# Patient Record
Sex: Female | Born: 1960 | Race: White | Hispanic: No | Smoking: Never smoker
Health system: Southern US, Community
[De-identification: ages and names within clinical notes are randomized; demographics above are authoritative.]

## PROBLEM LIST (undated history)

## (undated) DIAGNOSIS — E785 Hyperlipidemia, unspecified: Secondary | ICD-10-CM

## (undated) DIAGNOSIS — E611 Iron deficiency: Secondary | ICD-10-CM

## (undated) DIAGNOSIS — F419 Anxiety disorder, unspecified: Secondary | ICD-10-CM

## (undated) DIAGNOSIS — L509 Urticaria, unspecified: Secondary | ICD-10-CM

## (undated) DIAGNOSIS — I10 Essential (primary) hypertension: Secondary | ICD-10-CM

## (undated) DIAGNOSIS — E119 Type 2 diabetes mellitus without complications: Secondary | ICD-10-CM

## (undated) HISTORY — DX: Iron deficiency: E61.1

## (undated) HISTORY — DX: Urticaria, unspecified: L50.9

## (undated) HISTORY — DX: Anxiety disorder, unspecified: F41.9

## (undated) HISTORY — PX: CERVICAL ABLATION: SHX5771

## (undated) HISTORY — PX: HERNIA REPAIR: SHX51

## (undated) HISTORY — PX: TUBAL LIGATION: SHX77

---

## 1997-12-23 ENCOUNTER — Ambulatory Visit (HOSPITAL_COMMUNITY): Admission: RE | Admit: 1997-12-23 | Discharge: 1997-12-23 | Payer: Self-pay | Admitting: Urology

## 1998-03-24 ENCOUNTER — Encounter: Payer: Self-pay | Admitting: Urology

## 1998-03-24 ENCOUNTER — Ambulatory Visit (HOSPITAL_COMMUNITY): Admission: RE | Admit: 1998-03-24 | Discharge: 1998-03-24 | Payer: Self-pay | Admitting: Urology

## 1998-08-27 ENCOUNTER — Ambulatory Visit (HOSPITAL_COMMUNITY): Admission: RE | Admit: 1998-08-27 | Discharge: 1998-08-27 | Payer: Self-pay | Admitting: Obstetrics and Gynecology

## 1999-01-14 ENCOUNTER — Other Ambulatory Visit: Admission: RE | Admit: 1999-01-14 | Discharge: 1999-01-14 | Payer: Self-pay | Admitting: Obstetrics and Gynecology

## 1999-09-12 ENCOUNTER — Encounter: Payer: Self-pay | Admitting: Obstetrics and Gynecology

## 1999-09-12 ENCOUNTER — Ambulatory Visit (HOSPITAL_COMMUNITY): Admission: RE | Admit: 1999-09-12 | Discharge: 1999-09-12 | Payer: Self-pay | Admitting: Obstetrics and Gynecology

## 1999-09-13 ENCOUNTER — Encounter: Payer: Self-pay | Admitting: Urology

## 1999-09-13 ENCOUNTER — Encounter: Admission: RE | Admit: 1999-09-13 | Discharge: 1999-09-13 | Payer: Self-pay | Admitting: Urology

## 2000-01-16 ENCOUNTER — Other Ambulatory Visit: Admission: RE | Admit: 2000-01-16 | Discharge: 2000-01-16 | Payer: Self-pay | Admitting: Obstetrics and Gynecology

## 2000-10-04 ENCOUNTER — Encounter: Payer: Self-pay | Admitting: Obstetrics and Gynecology

## 2000-10-04 ENCOUNTER — Ambulatory Visit (HOSPITAL_COMMUNITY): Admission: RE | Admit: 2000-10-04 | Discharge: 2000-10-04 | Payer: Self-pay | Admitting: Obstetrics and Gynecology

## 2001-03-14 ENCOUNTER — Other Ambulatory Visit: Admission: RE | Admit: 2001-03-14 | Discharge: 2001-03-14 | Payer: Self-pay | Admitting: Obstetrics and Gynecology

## 2001-03-28 ENCOUNTER — Encounter: Admission: RE | Admit: 2001-03-28 | Discharge: 2001-03-28 | Payer: Self-pay | Admitting: Urology

## 2001-03-28 ENCOUNTER — Encounter: Payer: Self-pay | Admitting: Urology

## 2001-04-04 ENCOUNTER — Encounter: Payer: Self-pay | Admitting: Urology

## 2001-04-04 ENCOUNTER — Ambulatory Visit (HOSPITAL_BASED_OUTPATIENT_CLINIC_OR_DEPARTMENT_OTHER): Admission: RE | Admit: 2001-04-04 | Discharge: 2001-04-04 | Payer: Self-pay | Admitting: Urology

## 2001-04-17 ENCOUNTER — Encounter: Admission: RE | Admit: 2001-04-17 | Discharge: 2001-04-17 | Payer: Self-pay | Admitting: Urology

## 2001-04-17 ENCOUNTER — Encounter: Payer: Self-pay | Admitting: Urology

## 2001-10-07 ENCOUNTER — Encounter: Payer: Self-pay | Admitting: Obstetrics and Gynecology

## 2001-10-07 ENCOUNTER — Ambulatory Visit (HOSPITAL_COMMUNITY): Admission: RE | Admit: 2001-10-07 | Discharge: 2001-10-07 | Payer: Self-pay | Admitting: Obstetrics and Gynecology

## 2001-10-16 ENCOUNTER — Encounter: Admission: RE | Admit: 2001-10-16 | Discharge: 2001-10-16 | Payer: Self-pay | Admitting: Urology

## 2001-10-16 ENCOUNTER — Encounter: Payer: Self-pay | Admitting: Urology

## 2002-02-13 ENCOUNTER — Ambulatory Visit (HOSPITAL_BASED_OUTPATIENT_CLINIC_OR_DEPARTMENT_OTHER): Admission: RE | Admit: 2002-02-13 | Discharge: 2002-02-13 | Payer: Self-pay | Admitting: Family Medicine

## 2002-03-14 ENCOUNTER — Other Ambulatory Visit: Admission: RE | Admit: 2002-03-14 | Discharge: 2002-03-14 | Payer: Self-pay | Admitting: Obstetrics and Gynecology

## 2002-09-18 ENCOUNTER — Ambulatory Visit (HOSPITAL_COMMUNITY): Admission: RE | Admit: 2002-09-18 | Discharge: 2002-09-18 | Payer: Self-pay | Admitting: Obstetrics and Gynecology

## 2002-09-18 ENCOUNTER — Encounter: Payer: Self-pay | Admitting: Obstetrics and Gynecology

## 2002-11-17 ENCOUNTER — Encounter: Payer: Self-pay | Admitting: Emergency Medicine

## 2002-11-17 ENCOUNTER — Emergency Department (HOSPITAL_COMMUNITY): Admission: EM | Admit: 2002-11-17 | Discharge: 2002-11-17 | Payer: Self-pay | Admitting: Emergency Medicine

## 2003-03-19 ENCOUNTER — Other Ambulatory Visit: Admission: RE | Admit: 2003-03-19 | Discharge: 2003-03-19 | Payer: Self-pay | Admitting: Obstetrics and Gynecology

## 2003-10-19 ENCOUNTER — Ambulatory Visit (HOSPITAL_COMMUNITY): Admission: RE | Admit: 2003-10-19 | Discharge: 2003-10-19 | Payer: Self-pay | Admitting: Obstetrics and Gynecology

## 2004-03-29 ENCOUNTER — Other Ambulatory Visit: Admission: RE | Admit: 2004-03-29 | Discharge: 2004-03-29 | Payer: Self-pay | Admitting: Obstetrics and Gynecology

## 2005-02-23 ENCOUNTER — Ambulatory Visit (HOSPITAL_COMMUNITY): Admission: RE | Admit: 2005-02-23 | Discharge: 2005-02-23 | Payer: Self-pay | Admitting: Obstetrics and Gynecology

## 2005-04-06 ENCOUNTER — Other Ambulatory Visit: Admission: RE | Admit: 2005-04-06 | Discharge: 2005-04-06 | Payer: Self-pay | Admitting: Obstetrics and Gynecology

## 2006-02-26 ENCOUNTER — Ambulatory Visit (HOSPITAL_COMMUNITY): Admission: RE | Admit: 2006-02-26 | Discharge: 2006-02-26 | Payer: Self-pay | Admitting: Obstetrics and Gynecology

## 2006-04-26 ENCOUNTER — Ambulatory Visit (HOSPITAL_COMMUNITY): Admission: RE | Admit: 2006-04-26 | Discharge: 2006-04-26 | Payer: Self-pay | Admitting: Surgery

## 2006-06-06 ENCOUNTER — Other Ambulatory Visit: Admission: RE | Admit: 2006-06-06 | Discharge: 2006-06-06 | Payer: Self-pay | Admitting: Obstetrics and Gynecology

## 2007-03-05 ENCOUNTER — Encounter: Admission: RE | Admit: 2007-03-05 | Discharge: 2007-03-05 | Payer: Self-pay | Admitting: Obstetrics and Gynecology

## 2007-06-10 ENCOUNTER — Other Ambulatory Visit: Admission: RE | Admit: 2007-06-10 | Discharge: 2007-06-10 | Payer: Self-pay | Admitting: Obstetrics and Gynecology

## 2008-03-10 ENCOUNTER — Encounter: Admission: RE | Admit: 2008-03-10 | Discharge: 2008-03-10 | Payer: Self-pay | Admitting: Obstetrics and Gynecology

## 2008-04-19 ENCOUNTER — Emergency Department (HOSPITAL_COMMUNITY): Admission: EM | Admit: 2008-04-19 | Discharge: 2008-04-19 | Payer: Self-pay | Admitting: Family Medicine

## 2008-06-15 ENCOUNTER — Ambulatory Visit: Payer: Self-pay | Admitting: Obstetrics and Gynecology

## 2008-06-15 ENCOUNTER — Other Ambulatory Visit: Admission: RE | Admit: 2008-06-15 | Discharge: 2008-06-15 | Payer: Self-pay | Admitting: Obstetrics and Gynecology

## 2008-06-15 ENCOUNTER — Encounter: Payer: Self-pay | Admitting: Obstetrics and Gynecology

## 2009-03-16 ENCOUNTER — Encounter: Admission: RE | Admit: 2009-03-16 | Discharge: 2009-03-16 | Payer: Self-pay | Admitting: Obstetrics and Gynecology

## 2009-06-16 ENCOUNTER — Other Ambulatory Visit: Admission: RE | Admit: 2009-06-16 | Discharge: 2009-06-16 | Payer: Self-pay | Admitting: Obstetrics and Gynecology

## 2009-06-16 ENCOUNTER — Ambulatory Visit: Payer: Self-pay | Admitting: Obstetrics and Gynecology

## 2009-06-24 ENCOUNTER — Ambulatory Visit: Payer: Self-pay | Admitting: Obstetrics and Gynecology

## 2009-08-30 ENCOUNTER — Encounter (INDEPENDENT_AMBULATORY_CARE_PROVIDER_SITE_OTHER): Payer: Self-pay | Admitting: *Deleted

## 2009-09-16 ENCOUNTER — Encounter (INDEPENDENT_AMBULATORY_CARE_PROVIDER_SITE_OTHER): Payer: Self-pay | Admitting: *Deleted

## 2009-09-20 ENCOUNTER — Ambulatory Visit: Payer: Self-pay | Admitting: Gastroenterology

## 2009-09-20 ENCOUNTER — Encounter (INDEPENDENT_AMBULATORY_CARE_PROVIDER_SITE_OTHER): Payer: Self-pay | Admitting: *Deleted

## 2009-10-08 ENCOUNTER — Ambulatory Visit: Payer: Self-pay | Admitting: Gastroenterology

## 2009-10-12 ENCOUNTER — Encounter: Payer: Self-pay | Admitting: Gastroenterology

## 2010-03-22 ENCOUNTER — Encounter
Admission: RE | Admit: 2010-03-22 | Discharge: 2010-03-22 | Payer: Self-pay | Source: Home / Self Care | Attending: Obstetrics and Gynecology | Admitting: Obstetrics and Gynecology

## 2010-04-03 ENCOUNTER — Encounter: Payer: Self-pay | Admitting: Surgery

## 2010-04-12 NOTE — Letter (Signed)
Summary: Results Letter  [facility]  [address] [place], [place] [address]   Phone: [phone]  Fax: [phone]        October 12, 2009 MRN: [id number]    [facility] [address] [place], [place]  [address]    Dear Ms. Shropshire,   At least one of the polyps removed during your recent procedure was proven to be adenomatous.  These are pre-cancerous polyps that may have grown into cancers if they had not been removed.  Based on current nationally recognized surveillance guidelines, I recommend that you have a repeat colonoscopy in 5 years.  We will therefore put your information in our reminder system and will contact you in 5 years to schedule a repeat procedure.  Please call if you have any questions or concerns.       Sincerely,  Rachael Fee MD  This letter has been electronically signed by your physician.  Appended Document: Results Letter Letter mailed 8.3.2011

## 2010-04-12 NOTE — Letter (Signed)
Summary: [facility] Instructions  [facility]  [address] [place], [place] [address]   Phone: [phone]  Fax: [phone]       Katrina Lawson    06-20-1960    MRN: [id number]        Procedure Day /Date:  10/08/09  Friday     Arrival Time: 7:30am    (no need to get here earlier)      Procedure Time: 8:30am     Location of Procedure:                    _x _  [facility] (4th Floor)                        PREPARATION FOR COLONOSCOPY WITH MOVIPREP   Starting 5 days prior to your procedure _7/24/11 _ do not eat nuts, seeds, popcorn, corn, beans, peas,  salads, or any raw vegetables.  Do not take any fiber supplements (e.g. Metamucil, Citrucel, and Benefiber).  THE DAY BEFORE YOUR PROCEDURE         DATE:   10/07/09  DAY:  Thursday  1.  Drink clear liquids the entire day-NO SOLID FOOD  2.  Do not drink anything colored red or purple.  Avoid juices with pulp.  No orange juice.  3.  Drink at least 64 oz. (8 glasses) of fluid/clear liquids during the day to prevent dehydration and help the prep work efficiently.  CLEAR LIQUIDS INCLUDE: Water Jello Ice Popsicles Tea (sugar ok, no milk/cream) Powdered fruit flavored drinks Coffee (sugar ok, no milk/cream) Gatorade Juice: apple, white grape, white cranberry  Lemonade Clear bullion, consomm, broth Carbonated beverages (any kind) Strained chicken noodle soup Hard Candy                             4.  In the morning, mix first dose of MoviPrep solution:    Empty 1 Pouch A and 1 Pouch B into the disposable container    Add lukewarm drinking water to the top line of the container. Mix to dissolve    Refrigerate (mixed solution should be used within 24 hrs)  5.  Begin drinking the prep at 5:00 p.m. The MoviPrep container is divided by 4 marks.   Every 15 minutes drink the solution down to the next mark (approximately 8 oz) until the full liter is complete.   6.  Follow completed prep with 16  oz of clear liquid of your choice (Nothing red or purple).  Continue to drink clear liquids until bedtime.  7.  Before going to bed, mix second dose of MoviPrep solution:    Empty 1 Pouch A and 1 Pouch B into the disposable container    Add lukewarm drinking water to the top line of the container. Mix to dissolve    Refrigerate  THE DAY OF YOUR PROCEDURE      DATE:   10/08/09  DAY:  Friday  Beginning at 3:30 a.m. (5 hours before procedure):         1. Every 15 minutes, drink the solution down to the next mark (approx 8 oz) until the full liter is complete.  2. Follow completed prep with 16 oz. of clear liquid of your choice.    3. You may drink clear liquids until  6:30am  (2 HOURS BEFORE PROCEDURE).   MEDICATION INSTRUCTIONS  Unless otherwise instructed, you should  take regular prescription medications with a small sip of water   as early as possible the morning of your procedure.  Diabetic patients - see separate instructions.    Additional medication instructions: Hold Triamterene/HCTZ the morning of procedure.         OTHER INSTRUCTIONS  You will need a responsible adult at least 51 years of age to accompany you and drive you home.   This person must remain in the waiting room during your procedure.  Wear loose fitting clothing that is easily removed.  Leave jewelry and other valuables at home.  However, you may wish to bring a book to read or  an iPod/MP3 player to listen to music as you wait for your procedure to start.  Remove all body piercing jewelry and leave at home.  Total time from sign-in until discharge is approximately 2-3 hours.  You should go home directly after your procedure and rest.  You can resume normal activities the  day after your procedure.  The day of your procedure you should not:   Drive   Make legal decisions   Operate machinery   Drink alcohol   Return to work  You will receive specific instructions about eating,  activities and medications before you leave.    The above instructions have been reviewed and explained to me by  Wyona Almas RN  September 20, 2009 8:29 AM     I fully understand and can verbalize these instructions _____________________________ Date _________

## 2010-04-12 NOTE — Letter (Signed)
Summary: Diabetic Instructions  [facility]  [address] [place], [place] [address]   Phone: [phone]  Fax: [phone]    Katrina Lawson 09-23-1960 MRN: [id number]   _ x _   ORAL DIABETIC MEDICATION INSTRUCTIONS  The day before your procedure:   Take your diabetic pill as you do normally  The day of your procedure:   Do not take your diabetic pill    We will check your blood sugar levels during the admission process and again in Recovery before discharging you home  ________________________________________________________________________

## 2010-04-12 NOTE — Miscellaneous (Signed)
Summary: [facility] Previsit/prep  Clinical Lists Changes  Medications: Added new medication of MOVIPREP 100 GM  SOLR (PEG-KCL-NACL-NASULF-NA ASC-C) As per prep instructions. - Signed Rx of MOVIPREP 100 GM  SOLR (PEG-KCL-NACL-NASULF-NA ASC-C) As per prep instructions.;  #1 x 0;  Signed;  Entered by: Wyona Almas RN;  Authorized by: Rachael Fee MD;  Method used: Electronically to [facility] [phone]*, [address], [place], [place], [place]  [address], Ph: [phone] or [phone], Fax: [phone] Allergies: Added new allergy or adverse reaction of NSAIDS Observations: Added new observation of NKA: F (09/20/2009 8:03)    Prescriptions: MOVIPREP 100 GM  SOLR (PEG-KCL-NACL-NASULF-NA ASC-C) As per prep instructions.  #1 x 0   Entered by:   Wyona Almas RN   Authorized by:   Rachael Fee MD   Signed by:   Wyona Almas RN on 09/20/2009   Method used:   Electronically to        [facility] [phone]* (retail)       [address]       [place], [place]  [address]       Ph: [phone] or [phone]       Fax: [phone]   RxID:   [id number]

## 2010-04-12 NOTE — Letter (Signed)
Summary: Previsit letter  [facility]  [address] [place], [place] [address]   Phone: [phone]  Fax: [phone]       08/30/2009 MRN: [id number]  [facility] [address] [place], [place]  [address]  Dear Ms. Wrede,  Welcome to the Gastroenterology Division at [facility].    You are scheduled to see a nurse for your pre-procedure visit on 09/20/2009 at 8:00AM on the 3rd floor at [facility], [address]  We ask that you try to arrive at our office 15 minutes prior to your appointment time to allow for check-in.  Your nurse visit will consist of discussing your medical and surgical history, your immediate family medical history, and your medications.    Please bring a complete list of all your medications or, if you prefer, bring the medication bottles and we will list them.  We will need to be aware of both prescribed and over the counter drugs.  We will need to know exact dosage information as well.  If you are on blood thinners (Coumadin, Plavix, Aggrenox, Ticlid, etc.) please call our office today/prior to your appointment, as we need to consult with your physician about holding your medication.   Please be prepared to read and sign documents such as consent forms, a financial agreement, and acknowledgement forms.  If necessary, and with your consent, a friend or relative is welcome to sit-in on the nurse visit with you.  Please bring your insurance card so that we may make a copy of it.  If your insurance requires a referral to see a specialist, please bring your referral form from your primary care physician.  No co-pay is required for this nurse visit.     If you cannot keep your appointment, please call [phone] to cancel or reschedule prior to your appointment date.  This allows [place] the opportunity to schedule an appointment for another patient in need of care.    Thank you for choosing [facility] for your medical  needs.  We appreciate the opportunity to care for you.  Please visit [place] at our website  to learn more about our practice.                     Sincerely.                                                                                                                   The Gastroenterology Division

## 2010-04-12 NOTE — Procedures (Signed)
Summary: Colonoscopy  Patient: [name] Note: All result statuses are Final unless otherwise noted.  Tests: (1) Colonoscopy (COL)   COL Colonoscopy           DONE     [facility]     [address]     [place], [place]  [address]           COLONOSCOPY PROCEDURE REPORT           PATIENT:  Katrina, Lawson  MR#:  [id number]     BIRTHDATE:  Dec 07, 1960, 49 yrs. old  GENDER:  female     ENDOSCOPIST:  Rachael Fee, MD     REF. BY:  Edyth Gunnels, M.D.     PROCEDURE DATE:  10/08/2009     PROCEDURE:  Colonoscopy with snare polypectomy     ASA CLASS:  Class II     INDICATIONS:  Routine Risk Screening     MEDICATIONS:   Fentanyl 50 mcg IV, Versed 5 mg IV           DESCRIPTION OF PROCEDURE:   After the risks benefits and     alternatives of the procedure were thoroughly explained, informed     consent was obtained.  Digital rectal exam was performed and     revealed no rectal masses.   The LB [id number]-[id number] endoscope     was introduced through the anus and advanced to the cecum, which     was identified by both the appendix and ileocecal valve, without     limitations.  The quality of the prep was good, using MoviPrep.     The instrument was then slowly withdrawn as the colon was fully     examined.     <<PROCEDUREIMAGES>>           FINDINGS:  A sessile polyp was found in the ascending colon. This     was 5mm, removed with cold snare and sent to pathology (jar 1)     (see image3).  This was otherwise a normal examination of the     colon (see image4, image2, and image1).   Retroflexed views in the     rectum revealed no abnormalities.    The scope was then withdrawn     from the patient and the procedure completed.           COMPLICATIONS:  None     ENDOSCOPIC IMPRESSION:     1) Small sessile polyp in the ascending colon, removed and sent     to pathology     2) Otherwise normal examination           RECOMMENDATIONS:     1) If the polyp(s) removed today  are proven to be adenomatous     (pre-cancerous) polyps, you will need a repeat colonoscopy in 5     years. Otherwise you should continue to follow colorectal cancer     screening guidelines for "routine risk" patients with colonoscopy     in 10 years.     2) You will receive a letter within 1-2 weeks with the results     of your biopsy as well as final recommendations. Please call my     office if you have not received a letter after 3 weeks.           ______________________________     Rachael Fee, MD           n.  eSIGNED:   Rachael Fee at 10/08/2009 08:55 AM           Angelyn Punt, [phone]  Note: An exclamation mark (!) indicates a result that was not dispersed into the flowsheet. Document Creation Date: 10/08/2009 8:55 AM _______________________________________________________________________  (1) Order result status: Final Collection or observation date-time: 10/08/2009 08:47 Requested date-time:  Receipt date-time:  Reported date-time:  Referring Physician:   Ordering Physician: Rob Bunting [phone]) Specimen Source:  Source: Launa Grill Order Number: [id number] Lab site:   Appended Document: Colonoscopy     Procedures Next Due Date:    Colonoscopy: 10/2014

## 2010-05-28 LAB — GLUCOSE, CAPILLARY
Glucose-Capillary: 134 mg/dL — ABNORMAL HIGH (ref 70–99)
Glucose-Capillary: 145 mg/dL — ABNORMAL HIGH (ref 70–99)

## 2010-06-28 LAB — POCT I-STAT, CHEM 8
BUN: 14 mg/dL (ref 6–23)
Calcium, Ion: 1.09 mmol/L — ABNORMAL LOW (ref 1.12–1.32)
Creatinine, Ser: 0.8 mg/dL (ref 0.4–1.2)
Glucose, Bld: 126 mg/dL — ABNORMAL HIGH (ref 70–99)
TCO2: 29 mmol/L (ref 0–100)

## 2010-06-28 LAB — DIFFERENTIAL
Eosinophils Relative: 3 % (ref 0–5)
Lymphocytes Relative: 31 % (ref 12–46)
Lymphs Abs: 4 10*3/uL (ref 0.7–4.0)
Monocytes Absolute: 0.7 10*3/uL (ref 0.1–1.0)

## 2010-06-28 LAB — BASIC METABOLIC PANEL
Chloride: 100 mEq/L (ref 96–112)
GFR calc non Af Amer: >60 mL/min (ref >60)
Glucose, Bld: 121 mg/dL — ABNORMAL HIGH (ref 70–99)
Potassium: 4.5 mEq/L (ref 3.5–5.1)
Sodium: 138 mEq/L (ref 135–145)

## 2010-06-28 LAB — POCT CARDIAC MARKERS: Myoglobin, poc: 33.3 ng/mL (ref 12–200)

## 2010-06-28 LAB — CBC
HCT: 43.6 % (ref 36.0–46.0)
Hemoglobin: 14.9 g/dL (ref 12.0–15.0)
WBC: 12.6 10*3/uL — ABNORMAL HIGH (ref 4.0–10.5)

## 2010-07-05 ENCOUNTER — Other Ambulatory Visit: Payer: Self-pay | Admitting: Obstetrics and Gynecology

## 2010-07-05 ENCOUNTER — Encounter (INDEPENDENT_AMBULATORY_CARE_PROVIDER_SITE_OTHER): Payer: BC Managed Care – PPO | Admitting: Obstetrics and Gynecology

## 2010-07-05 ENCOUNTER — Other Ambulatory Visit (HOSPITAL_COMMUNITY)
Admission: RE | Admit: 2010-07-05 | Discharge: 2010-07-05 | Disposition: A | Discharge status: Home / Self Care | Payer: BC Managed Care – PPO | Source: Ambulatory Visit | Attending: Obstetrics and Gynecology | Admitting: Obstetrics and Gynecology

## 2010-07-05 DIAGNOSIS — R82998 Other abnormal findings in urine: Secondary | ICD-10-CM

## 2010-07-05 DIAGNOSIS — Z01419 Encounter for gynecological examination (general) (routine) without abnormal findings: Secondary | ICD-10-CM

## 2010-07-05 DIAGNOSIS — Z124 Encounter for screening for malignant neoplasm of cervix: Secondary | ICD-10-CM | POA: Insufficient documentation

## 2011-03-27 ENCOUNTER — Other Ambulatory Visit: Payer: Self-pay | Admitting: Obstetrics and Gynecology

## 2011-03-27 DIAGNOSIS — Z1231 Encounter for screening mammogram for malignant neoplasm of breast: Secondary | ICD-10-CM

## 2011-03-31 ENCOUNTER — Ambulatory Visit: Payer: BC Managed Care – PPO

## 2011-04-04 ENCOUNTER — Ambulatory Visit: Payer: BC Managed Care – PPO

## 2011-05-01 ENCOUNTER — Ambulatory Visit
Admission: RE | Admit: 2011-05-01 | Discharge: 2011-05-01 | Disposition: A | Discharge status: Home / Self Care | Payer: BC Managed Care – PPO | Source: Ambulatory Visit | Attending: Obstetrics and Gynecology | Admitting: Obstetrics and Gynecology

## 2011-05-01 DIAGNOSIS — Z1231 Encounter for screening mammogram for malignant neoplasm of breast: Secondary | ICD-10-CM

## 2012-07-04 ENCOUNTER — Other Ambulatory Visit: Payer: Self-pay

## 2012-07-04 DIAGNOSIS — Z1231 Encounter for screening mammogram for malignant neoplasm of breast: Secondary | ICD-10-CM

## 2012-07-23 ENCOUNTER — Ambulatory Visit
Admission: RE | Admit: 2012-07-23 | Discharge: 2012-07-23 | Disposition: A | Discharge status: Home / Self Care | Payer: BC Managed Care – PPO | Source: Ambulatory Visit

## 2012-07-23 DIAGNOSIS — Z1231 Encounter for screening mammogram for malignant neoplasm of breast: Secondary | ICD-10-CM

## 2013-07-17 ENCOUNTER — Other Ambulatory Visit: Payer: Self-pay

## 2013-07-17 DIAGNOSIS — Z1231 Encounter for screening mammogram for malignant neoplasm of breast: Secondary | ICD-10-CM

## 2013-08-08 ENCOUNTER — Encounter (INDEPENDENT_AMBULATORY_CARE_PROVIDER_SITE_OTHER): Payer: Self-pay

## 2013-08-08 ENCOUNTER — Ambulatory Visit
Admission: RE | Admit: 2013-08-08 | Discharge: 2013-08-08 | Disposition: A | Discharge status: Home / Self Care | Payer: BC Managed Care – PPO | Source: Ambulatory Visit

## 2013-08-08 DIAGNOSIS — Z1231 Encounter for screening mammogram for malignant neoplasm of breast: Secondary | ICD-10-CM

## 2013-09-11 ENCOUNTER — Ambulatory Visit: Payer: Self-pay

## 2014-06-16 ENCOUNTER — Emergency Department (HOSPITAL_COMMUNITY): Payer: Worker's Compensation

## 2014-06-16 ENCOUNTER — Encounter (HOSPITAL_COMMUNITY): Payer: Self-pay

## 2014-06-16 ENCOUNTER — Emergency Department (HOSPITAL_COMMUNITY)
Admission: EM | Admit: 2014-06-16 | Discharge: 2014-06-16 | Disposition: A | Discharge status: Home / Self Care | Payer: Worker's Compensation | Attending: Emergency Medicine | Admitting: Emergency Medicine

## 2014-06-16 DIAGNOSIS — Y9289 Other specified places as the place of occurrence of the external cause: Secondary | ICD-10-CM | POA: Diagnosis not present

## 2014-06-16 DIAGNOSIS — W19XXXA Unspecified fall, initial encounter: Secondary | ICD-10-CM

## 2014-06-16 DIAGNOSIS — E785 Hyperlipidemia, unspecified: Secondary | ICD-10-CM | POA: Insufficient documentation

## 2014-06-16 DIAGNOSIS — S20229A Contusion of unspecified back wall of thorax, initial encounter: Secondary | ICD-10-CM | POA: Insufficient documentation

## 2014-06-16 DIAGNOSIS — W01198A Fall on same level from slipping, tripping and stumbling with subsequent striking against other object, initial encounter: Secondary | ICD-10-CM | POA: Diagnosis not present

## 2014-06-16 DIAGNOSIS — R52 Pain, unspecified: Secondary | ICD-10-CM

## 2014-06-16 DIAGNOSIS — S0083XA Contusion of other part of head, initial encounter: Secondary | ICD-10-CM | POA: Diagnosis not present

## 2014-06-16 DIAGNOSIS — Y990 Civilian activity done for income or pay: Secondary | ICD-10-CM | POA: Diagnosis not present

## 2014-06-16 DIAGNOSIS — E119 Type 2 diabetes mellitus without complications: Secondary | ICD-10-CM | POA: Diagnosis not present

## 2014-06-16 DIAGNOSIS — Y9301 Activity, walking, marching and hiking: Secondary | ICD-10-CM | POA: Insufficient documentation

## 2014-06-16 DIAGNOSIS — M25569 Pain in unspecified knee: Secondary | ICD-10-CM

## 2014-06-16 DIAGNOSIS — S0993XA Unspecified injury of face, initial encounter: Secondary | ICD-10-CM | POA: Diagnosis present

## 2014-06-16 DIAGNOSIS — I10 Essential (primary) hypertension: Secondary | ICD-10-CM | POA: Diagnosis not present

## 2014-06-16 DIAGNOSIS — S2020XA Contusion of thorax, unspecified, initial encounter: Secondary | ICD-10-CM

## 2014-06-16 HISTORY — DX: Essential (primary) hypertension: I10

## 2014-06-16 HISTORY — DX: Hyperlipidemia, unspecified: E78.5

## 2014-06-16 HISTORY — DX: Type 2 diabetes mellitus without complications: E11.9

## 2014-06-16 LAB — CBG MONITORING, ED: GLUCOSE-CAPILLARY: 200 mg/dL — AB (ref 70–99)

## 2014-06-16 LAB — I-STAT CHEM 8, ED
BUN: 16 mg/dL (ref 6–23)
CREATININE: 0.7 mg/dL (ref 0.50–1.10)
Calcium, Ion: 1.26 mmol/L — ABNORMAL HIGH (ref 1.12–1.23)
Chloride: 99 mmol/L (ref 96–112)
Glucose, Bld: 151 mg/dL — ABNORMAL HIGH (ref 70–99)
HCT: 42 % (ref 36.0–46.0)
Hemoglobin: 14.3 g/dL (ref 12.0–15.0)
POTASSIUM: 4.2 mmol/L (ref 3.5–5.1)
SODIUM: 139 mmol/L (ref 135–145)
TCO2: 26 mmol/L (ref 0–100)

## 2014-06-16 MED ORDER — HYDROCODONE-ACETAMINOPHEN 5-325 MG PO TABS
1.0000 | ORAL_TABLET | Freq: Once | ORAL | Status: AC
Start: 2014-06-16 — End: 2014-06-16
  Administered 2014-06-16: 1 via ORAL
  Filled 2014-06-16: qty 1

## 2014-06-16 MED ORDER — HYDROCODONE-ACETAMINOPHEN 5-325 MG PO TABS: 1.0000 | ORAL_TABLET | Freq: Four times a day (QID) | ORAL | Status: DC | PRN

## 2014-06-16 NOTE — ED Notes (Signed)
Patient ambulated in hall without difficulty.

## 2014-06-16 NOTE — ED Provider Notes (Addendum)
CSN: [id number]     Arrival date & time 06/16/14  0736 History   First MD Initiated Contact with Patient 06/16/14 [phone]     Chief Complaint  Patient presents with  . 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 NO LOC. Denies preceeding CP, SON, palpitations, n/v, diaphoresis.    Past Medical History  Diagnosis Date  . Diabetes mellitus without complication   . Hypertension   . Hyperlipidemia    Past Surgical History  Procedure Laterality Date  . Tubal ligation     No family history on file. History  Substance Use Topics  . Smoking status: Never Smoker   . Smokeless tobacco: Not on file  . Alcohol Use: No   OB History    No data available     Review of Systems  Constitutional: Negative for fever, chills, diaphoresis, activity change, appetite change and fatigue.  HENT: Negative for congestion, facial swelling, rhinorrhea and sore throat.   Eyes: Negative for photophobia and discharge.  Respiratory: Negative for cough, chest tightness and shortness of breath.   Cardiovascular: Negative for chest pain, palpitations and leg swelling.  Gastrointestinal: Negative for nausea, vomiting, abdominal pain and diarrhea.  Endocrine: Negative for polydipsia and polyuria.  Genitourinary: Negative for dysuria, frequency, difficulty urinating and pelvic pain.  Musculoskeletal: Negative for back pain, arthralgias, neck pain and neck stiffness.  Skin: Negative for color change and wound.  Allergic/Immunologic: Negative for immunocompromised state.  Neurological: Positive for headaches. Negative for facial asymmetry, weakness and numbness.  Hematological: Does not bruise/bleed easily.  Psychiatric/Behavioral: Negative for confusion and agitation.      Allergies  Nsaids  Home Medications    Prior to Admission medications   Medication Sig Start Date End Date Taking? Authorizing Provider  CINNAMON PO Take 1 tablet by mouth 2 (two) times daily.   Yes Historical Provider, MD  Coenzyme Q10 (COQ10) 200 MG CAPS Take 1 tablet by mouth 2 (two) times daily.   Yes Historical Provider, MD  lisinopril (PRINIVIL,ZESTRIL) 5 MG tablet Take 5 mg by mouth at bedtime.   Yes Historical Provider, MD  metFORMIN (GLUCOPHAGE) 500 MG tablet Take 500 mg by mouth 3 (three) times daily.   Yes Historical Provider, MD  Multiple Vitamins-Minerals (MULTIVITAMIN WITH MINERALS) tablet Take 1 tablet by mouth daily.   Yes Historical Provider, MD  Omega 3 1000 MG CAPS Take 2 capsules by mouth 2 (two) times daily.   Yes Historical Provider, MD  Red Yeast Rice 600 MG CAPS Take 1 capsule by mouth 2 (two) times daily.   Yes Historical Provider, MD  triamterene-hydrochlorothiazide (MAXZIDE) 75-50 MG per tablet Take 1 tablet by mouth daily.   Yes Historical Provider, MD  venlafaxine XR (EFFEXOR-XR) 150 MG 24 hr capsule Take 150 mg by mouth daily with breakfast.   Yes Historical Provider, MD  HYDROcodone-acetaminophen (NORCO) 5-325 MG per tablet Take 1 tablet by mouth every 6 (six) hours as needed. 06/16/14   Toy Cookey, MD   BP 103/70 mmHg  Pulse 97  Temp(Src) 97.9 F (36.6 C) (Oral)  Resp 22  Ht  (1.6 m)  Wt 228 lb (103.42 kg)  BMI 40.40 kg/m2  SpO2 100% Physical Exam  Constitutional: She is oriented to person, place, and time. She appears well-developed and well-nourished. No distress.  HENT:  Head: Normocephalic and atraumatic.  Mouth/Throat: No oropharyngeal exudate.  Eyes: Pupils are equal, round, and reactive to light.  Neck: Normal range of motion. Neck supple.  Cardiovascular: Normal rate, regular rhythm and normal heart sounds.  Exam reveals no gallop and no friction rub.   No murmur heard. Pulmonary/Chest: Effort normal and breath sounds normal. No respiratory distress. She has no wheezes.  She has no rales.  Abdominal: Soft. Bowel sounds are normal. She exhibits no distension and no mass. There is no tenderness. There is no rebound and no guarding.  Musculoskeletal: Normal range of motion. She exhibits no edema.       Left knee: Tenderness found.       Arms: Neurological: She is alert and oriented to person, place, and time.  Skin: Skin is warm and dry.  Psychiatric: She has a normal mood and affect.    ED Course  Procedures (including critical care time) Labs Review Labs Reviewed  CBG MONITORING, ED - Abnormal; Notable for the following:    Glucose-Capillary 200 (*)    All other components within normal limits  I-STAT CHEM 8, ED - Abnormal; Notable for the following:    Glucose, Bld 151 (*)    Calcium, Ion 1.26 (*)    All other components within normal limits    Imaging Review Dg Forearm Right  06/16/2014   CLINICAL DATA:  Right forearm pain.  EXAM: RIGHT FOREARM - 2 VIEW  COMPARISON:  None.  FINDINGS: There is no evidence of fracture or other focal bone lesions. Soft tissues are unremarkable.  IMPRESSION: Normal right forearm.   Electronically Signed   By: Lupita Raider, M.D.   On: 06/16/2014 09:21   Ct Head Wo Contrast  06/16/2014   CLINICAL DATA:  Fall from standing while walking across a parking lot, striking the left face. Craniofacial pain and swelling and bruising. Diabetes. Hypertension.  EXAM: CT HEAD WITHOUT CONTRAST  CT MAXILLOFACIAL WITHOUT CONTRAST  CT CERVICAL SPINE WITHOUT CONTRAST  TECHNIQUE: Multidetector CT imaging of the head, cervical spine, and maxillofacial structures were performed using the standard protocol without intravenous contrast. Multiplanar CT image reconstructions of the cervical spine and maxillofacial structures were also generated.  COMPARISON:  Report from 11/17/2002  FINDINGS: CT HEAD FINDINGS  The brainstem, cerebellum, cerebral peduncles, thalamus, basal ganglia, basilar cisterns, and ventricular system appear within normal limits.  No intracranial hemorrhage, mass lesion, or acute CVA.  CT MAXILLOFACIAL FINDINGS  No facial fracture identified. Left infraorbital and mild periorbital soft tissue swelling, especially anterior to the maxilla and zygomatic arch, and inferior orbital rim, without postseptal/ intraorbital extension. The globes appear symmetric.  CT CERVICAL SPINE FINDINGS  No cervical spine fracture or significant abnormal subluxation. No prevertebral soft tissue swelling or significant bony lesion observed. There is multilevel mild uncinate spurring including C3-4, C4-5, and C6-7 but without osseous foraminal stenosis.  IMPRESSION: 1. Left intraorbital and periorbital soft tissue swelling but without underlying fracture, intraorbital abnormality, intracranial abnormality, or acute cervical spine abnormality. 2. Mild cervical spondylosis.   Electronically Signed   By: Gaylyn Rong M.D.   On: 06/16/2014 09:47   Ct Cervical Spine Wo Contrast  06/16/2014   CLINICAL DATA:  Fall from standing while walking across a parking lot, striking the left face. Craniofacial pain and swelling and bruising. Diabetes. Hypertension.  EXAM: CT HEAD WITHOUT CONTRAST  CT MAXILLOFACIAL WITHOUT CONTRAST  CT CERVICAL SPINE WITHOUT CONTRAST  TECHNIQUE: Multidetector CT imaging of the head, cervical spine, and maxillofacial structures were performed using the  standard protocol without intravenous contrast. Multiplanar CT image reconstructions of the cervical spine and maxillofacial structures were also generated.  COMPARISON:  Report from 11/17/2002  FINDINGS: CT HEAD FINDINGS  The brainstem, cerebellum, cerebral peduncles, thalamus, basal ganglia, basilar cisterns, and ventricular system appear within normal limits. No intracranial hemorrhage, mass lesion, or acute CVA.  CT MAXILLOFACIAL FINDINGS  No facial fracture identified. Left infraorbital and mild periorbital soft tissue swelling, especially anterior to the maxilla and zygomatic arch, and  inferior orbital rim, without postseptal/ intraorbital extension. The globes appear symmetric.  CT CERVICAL SPINE FINDINGS  No cervical spine fracture or significant abnormal subluxation. No prevertebral soft tissue swelling or significant bony lesion observed. There is multilevel mild uncinate spurring including C3-4, C4-5, and C6-7 but without osseous foraminal stenosis.  IMPRESSION: 1. Left intraorbital and periorbital soft tissue swelling but without underlying fracture, intraorbital abnormality, intracranial abnormality, or acute cervical spine abnormality. 2. Mild cervical spondylosis.   Electronically Signed   By: Gaylyn RongWalter  Liebkemann M.D.   On: 06/16/2014 09:47   Dg Knee Complete 4 Views Right  06/16/2014   CLINICAL DATA:  Lateral right knee pain.  Fall this morning.  EXAM: RIGHT KNEE - COMPLETE 4+ VIEW  COMPARISON:  None.  FINDINGS: Mild marginal spurring in the medial compartment. Mild spurring of the tibial spine. Mild articular spurring of the patella.  No fracture observed. No large knee effusion; sensitivity for small knee effusion reduced by flexion of the knee on the lateral projection.  IMPRESSION: 1. Mild degenerative findings of the knee. No acute bony findings. No large knee effusion identified.   Electronically Signed   By: Gaylyn RongWalter  Liebkemann M.D.   On: 06/16/2014 09:22   Ct Maxillofacial Wo Cm  06/16/2014   CLINICAL DATA:  Fall from standing while walking across a parking lot, striking the left face. Craniofacial pain and swelling and bruising. Diabetes. Hypertension.  EXAM: CT HEAD WITHOUT CONTRAST  CT MAXILLOFACIAL WITHOUT CONTRAST  CT CERVICAL SPINE WITHOUT CONTRAST  TECHNIQUE: Multidetector CT imaging of the head, cervical spine, and maxillofacial structures were performed using the standard protocol without intravenous contrast. Multiplanar CT image reconstructions of the cervical spine and maxillofacial structures were also generated.  COMPARISON:  Report from 11/17/2002  FINDINGS: CT  HEAD FINDINGS  The brainstem, cerebellum, cerebral peduncles, thalamus, basal ganglia, basilar cisterns, and ventricular system appear within normal limits. No intracranial hemorrhage, mass lesion, or acute CVA.  CT MAXILLOFACIAL FINDINGS  No facial fracture identified. Left infraorbital and mild periorbital soft tissue swelling, especially anterior to the maxilla and zygomatic arch, and inferior orbital rim, without postseptal/ intraorbital extension. The globes appear symmetric.  CT CERVICAL SPINE FINDINGS  No cervical spine fracture or significant abnormal subluxation. No prevertebral soft tissue swelling or significant bony lesion observed. There is multilevel mild uncinate spurring including C3-4, C4-5, and C6-7 but without osseous foraminal stenosis.  IMPRESSION: 1. Left intraorbital and periorbital soft tissue swelling but without underlying fracture, intraorbital abnormality, intracranial abnormality, or acute cervical spine abnormality. 2. Mild cervical spondylosis.   Electronically Signed   By: Gaylyn RongWalter  Liebkemann M.D.   On: 06/16/2014 09:47     EKG Interpretation None      MDM   Final diagnoses:  Lateral knee pain  Fall from standing, initial encounter  Facial contusion, initial encounter  Contusion, multiple sites of trunk, initial encounter    Pt is a 54 y.o. female with Pmhx as above who presents with fall at work. Pt states she was walking around  her school bus, lost balance, and hit the ground + LOC. She complains of h/a, L forearm pain, L knee pain. She denies CP, ab pain, pelvic pain, SOB. CT head, face, c-spine negative, xr knee and forearm negative. EKG w/o acute findings. Pt able to ambulate w/o difficulty. Hx no c/w cardiogenic syncope.      Pricilla Handler evaluation in the Emergency Department is complete. It has been determined that no acute conditions requiring further emergency intervention are present at this time. The patient/guardian have been advised of the  diagnosis and plan. We have discussed signs and symptoms that warrant return to the ED, such as changes or worsening in symptoms, worsneing pain, numbness, weakness, confusion, CP, SOB.       Toy Cookey, MD 06/17/14 1610  Toy Cookey, MD 06/17/14 1946

## 2014-06-16 NOTE — Discharge Instructions (Signed)
Head Injury °You have received a head injury. It does not appear serious at this time. Headaches and vomiting are common following head injury. It should be easy to awaken from sleeping. Sometimes it is necessary for you to stay in the emergency department for a while for observation. Sometimes admission to the hospital may be needed. After injuries such as yours, most problems occur within the first 24 hours, but side effects may occur up to 7-10 days after the injury. It is important for you to carefully monitor your condition and contact your health care provider or seek immediate medical care if there is a change in your condition. °WHAT ARE THE TYPES OF HEAD INJURIES? °Head injuries can be as minor as a bump. Some head injuries can be more severe. More severe head injuries include: °· A jarring injury to the brain (concussion). °· A bruise of the brain (contusion). This mean there is bleeding in the brain that can cause swelling. °· A cracked skull (skull fracture). °· Bleeding in the brain that collects, clots, and forms a bump (hematoma). °WHAT CAUSES A HEAD INJURY? °A serious head injury is most likely to happen to someone who is in a car wreck and is not wearing a seat belt. Other causes of major head injuries include bicycle or motorcycle accidents, sports injuries, and falls. °HOW ARE HEAD INJURIES DIAGNOSED? °A complete history of the event leading to the injury and your current symptoms will be helpful in diagnosing head injuries. Many times, pictures of the brain, such as CT or MRI are needed to see the extent of the injury. Often, an overnight hospital stay is necessary for observation.  °WHEN SHOULD I SEEK IMMEDIATE MEDICAL CARE?  °You should get help right away if: °· You have confusion or drowsiness. °· You feel sick to your stomach (nauseous) or have continued, forceful vomiting. °· You have dizziness or unsteadiness that is getting worse. °· You have severe, continued headaches not relieved by  medicine. Only take over-the-counter or prescription medicines for pain, fever, or discomfort as directed by your health care provider. °· You do not have normal function of the arms or legs or are unable to walk. °· You notice changes in the black spots in the center of the colored part of your eye (pupil). °· You have a clear or bloody fluid coming from your nose or ears. °· You have a loss of vision. °During the next 24 hours after the injury, you must stay with someone who can watch you for the warning signs. This person should contact local emergency services (911 in the [place] if you have seizures, you become unconscious, or you are unable to wake up. °HOW CAN I PREVENT A HEAD INJURY IN THE FUTURE? °The most important factor for preventing major head injuries is avoiding motor vehicle accidents.  To minimize the potential for damage to your head, it is crucial to wear seat belts while riding in motor vehicles. Wearing helmets while bike riding and playing collision sports (like football) is also helpful. Also, avoiding dangerous activities around the house will further help reduce your risk of head injury.  °WHEN CAN I RETURN TO NORMAL ACTIVITIES AND ATHLETICS? °You should be reevaluated by your health care provider before returning to these activities. If you have any of the following symptoms, you should not return to activities or contact sports until 1 week after the symptoms have stopped: °· Persistent headache. °· Dizziness or vertigo. °· Poor attention and concentration. °· Confusion. °·   Memory problems.  Nausea or vomiting.  Fatigue or tire easily.  Irritability.  Intolerant of bright lights or loud noises.  Anxiety or depression.  Disturbed sleep. MAKE SURE YOU:   Understand these instructions.  Will watch your condition.  Will get help right away if you are not doing well or get worse. Document Released: 02/27/2005 Document Revised: 03/04/2013 Document Reviewed:  11/04/2012 [facility] Patient Information 2015 [employer], [place]. This information is not intended to replace advice given to you by your health care provider. Make sure you discuss any questions you have with your health care provider.   Contusion A contusion is the result of an injury to the skin and underlying tissues and is usually caused by direct trauma. The injury results in the appearance of a bruise on the skin overlying the injured tissues. Contusions cause rupture and bleeding of the small capillaries and blood vessels and affect function, because the bleeding infiltrates muscles, tendons, nerves, or other soft tissues.  SYMPTOMS   Swelling and often a hard lump in the injured area, either superficial or deep.  Pain and tenderness over the area of the contusion.  Feeling of firmness when pressure is exerted over the contusion.  Discoloration under the skin, beginning with redness and progressing to the characteristic "black and blue" bruise. CAUSES  A contusion is typically the result of direct trauma. This is often by a blunt object.  RISK INCREASES WITH:  Sports that have a high likelihood of trauma (football, boxing, ice hockey, soccer, field hockey, martial arts, basketball, and baseball).  Sports that make falling from a height likely (high-jumping, pole-vaulting, skating, or gymnastics).  Any bleeding disorder (hemophilia) or taking medications that affect clotting (aspirin, nonsteroidal anti-inflammatory medications, or warfarin [Coumadin]).  Inadequate protection of exposed areas during contact sports. PREVENTION  Maintain physical fitness:  Joint and muscle flexibility.  Strength and endurance.  Coordination.  Wear proper protective equipment. Make sure it fits correctly. PROGNOSIS  Contusions typically heal without any complications. Healing time varies with the severity of injury and intake of medications that affect clotting. Contusions usually heal in 1 to 4  weeks. RELATED COMPLICATIONS   Damage to nearby nerves or blood vessels, causing numbness, coldness, or paleness.  Compartment syndrome.  Bleeding into the soft tissues that leads to disability.  Infiltrative-type bleeding, leading to the calcification and impaired function of the injured muscle (rare).  Prolonged healing time if usual activities are resumed too soon.  Infection if the skin over the injury site is broken.  Fracture of the bone underlying the contusion.  Stiffness in the joint where the injured muscle crosses. TREATMENT  Treatment initially consists of resting the injured area as well as medication and ice to reduce inflammation. The use of a compression bandage may also be helpful in minimizing inflammation. As pain diminishes and movement is tolerated, the joint where the affected muscle crosses should be moved to prevent stiffness and the shortening (contracture) of the joint. Movement of the joint should begin as soon as possible. It is also important to work on maintaining strength within the affected muscles. Occasionally, extra padding over the area of contusion may be recommended before returning to sports, particularly if re-injury is likely.  MEDICATION   If pain relief is necessary these medications are often recommended:  Nonsteroidal anti-inflammatory medications, such as aspirin and ibuprofen.  Other minor pain relievers, such as acetaminophen, are often recommended.  Prescription pain relievers may be given by your caregiver. Use only as directed and  only as much as you need. HEAT AND COLD  Cold treatment (icing) relieves pain and reduces inflammation. Cold treatment should be applied for 10 to 15 minutes every 2 to 3 hours for inflammation and pain and immediately after any activity that aggravates your symptoms. Use ice packs or an ice massage. (To do an ice massage fill a large styrofoam cup with water and freeze. Tear a small amount of foam from the  top so ice protrudes. Massage ice firmly over the injured area in a circle about the size of a softball.)  Heat treatment may be used prior to performing the stretching and strengthening activities prescribed by your caregiver, physical therapist, or athletic trainer. Use a heat pack or a warm soak. SEEK MEDICAL CARE IF:   Symptoms get worse or do not improve despite treatment in a few days.  You have difficulty moving a joint.  Any extremity becomes extremely painful, numb, pale, or cool (This is an emergency!).  Medication produces any side effects (bleeding, upset stomach, or allergic reaction).  Signs of infection (drainage from skin, headache, muscle aches, dizziness, fever, or general ill feeling) occur if skin was broken. Document Released: 02/27/2005 Document Revised: 05/22/2011 Document Reviewed: 06/11/2008 [facility] Patient Information 2015 [place], [place]. This information is not intended to replace advice given to you by your health care provider. Make sure you discuss any questions you have with your health care provider.
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
## 2014-09-24 ENCOUNTER — Encounter: Payer: Self-pay | Admitting: Gastroenterology

## 2014-10-13 ENCOUNTER — Encounter: Payer: Self-pay | Admitting: Gastroenterology

## 2015-03-14 HISTORY — PX: ROUX-EN-Y PROCEDURE: SUR1287

## 2015-06-29 ENCOUNTER — Other Ambulatory Visit: Payer: Self-pay | Admitting: Specialist

## 2015-06-29 DIAGNOSIS — E119 Type 2 diabetes mellitus without complications: Secondary | ICD-10-CM | POA: Insufficient documentation

## 2015-06-29 DIAGNOSIS — R12 Heartburn: Secondary | ICD-10-CM | POA: Insufficient documentation

## 2015-06-29 DIAGNOSIS — I10 Essential (primary) hypertension: Secondary | ICD-10-CM | POA: Insufficient documentation

## 2015-06-29 DIAGNOSIS — F32A Depression, unspecified: Secondary | ICD-10-CM | POA: Insufficient documentation

## 2015-07-08 ENCOUNTER — Ambulatory Visit
Admission: RE | Admit: 2015-07-08 | Discharge: 2015-07-08 | Disposition: A | Discharge status: Home / Self Care | Payer: BC Managed Care – PPO | Source: Ambulatory Visit | Attending: Specialist | Admitting: Specialist

## 2015-07-08 DIAGNOSIS — E7800 Pure hypercholesterolemia, unspecified: Secondary | ICD-10-CM | POA: Insufficient documentation

## 2015-07-08 DIAGNOSIS — E119 Type 2 diabetes mellitus without complications: Secondary | ICD-10-CM | POA: Diagnosis not present

## 2015-07-08 DIAGNOSIS — I10 Essential (primary) hypertension: Secondary | ICD-10-CM | POA: Diagnosis present

## 2015-07-08 DIAGNOSIS — R918 Other nonspecific abnormal finding of lung field: Secondary | ICD-10-CM | POA: Diagnosis not present

## 2015-07-08 DIAGNOSIS — K760 Fatty (change of) liver, not elsewhere classified: Secondary | ICD-10-CM | POA: Insufficient documentation

## 2015-07-12 DIAGNOSIS — K760 Fatty (change of) liver, not elsewhere classified: Secondary | ICD-10-CM | POA: Insufficient documentation

## 2015-08-17 DIAGNOSIS — E781 Pure hyperglyceridemia: Secondary | ICD-10-CM | POA: Insufficient documentation

## 2015-08-17 DIAGNOSIS — E611 Iron deficiency: Secondary | ICD-10-CM | POA: Insufficient documentation

## 2015-08-27 DIAGNOSIS — G4733 Obstructive sleep apnea (adult) (pediatric): Secondary | ICD-10-CM | POA: Insufficient documentation

## 2015-09-23 ENCOUNTER — Other Ambulatory Visit: Payer: Self-pay | Admitting: Family Medicine

## 2015-09-23 DIAGNOSIS — Z1231 Encounter for screening mammogram for malignant neoplasm of breast: Secondary | ICD-10-CM

## 2015-10-07 ENCOUNTER — Ambulatory Visit: Payer: Self-pay

## 2016-03-13 HISTORY — PX: CHOLECYSTECTOMY: SHX55

## 2016-10-24 DIAGNOSIS — Z9884 Bariatric surgery status: Secondary | ICD-10-CM | POA: Insufficient documentation

## 2016-12-05 DIAGNOSIS — R110 Nausea: Secondary | ICD-10-CM | POA: Insufficient documentation

## 2017-01-09 DIAGNOSIS — R1013 Epigastric pain: Secondary | ICD-10-CM | POA: Insufficient documentation

## 2017-01-09 DIAGNOSIS — R748 Abnormal levels of other serum enzymes: Secondary | ICD-10-CM | POA: Insufficient documentation

## 2017-04-27 DIAGNOSIS — Z6831 Body mass index (BMI) 31.0-31.9, adult: Secondary | ICD-10-CM | POA: Insufficient documentation

## 2018-05-21 ENCOUNTER — Other Ambulatory Visit: Payer: Self-pay | Admitting: Physician Assistant

## 2018-05-21 DIAGNOSIS — Z1231 Encounter for screening mammogram for malignant neoplasm of breast: Secondary | ICD-10-CM

## 2018-06-19 ENCOUNTER — Ambulatory Visit: Payer: Self-pay

## 2018-09-18 ENCOUNTER — Ambulatory Visit
Admission: RE | Admit: 2018-09-18 | Discharge: 2018-09-18 | Disposition: A | Discharge status: Home / Self Care | Payer: BC Managed Care – PPO | Source: Ambulatory Visit | Attending: Physician Assistant | Admitting: Physician Assistant

## 2018-09-18 ENCOUNTER — Other Ambulatory Visit: Payer: Self-pay

## 2018-09-18 DIAGNOSIS — Z1231 Encounter for screening mammogram for malignant neoplasm of breast: Secondary | ICD-10-CM

## 2018-09-20 ENCOUNTER — Other Ambulatory Visit: Payer: Self-pay | Admitting: Physician Assistant

## 2018-09-20 ENCOUNTER — Other Ambulatory Visit: Payer: Self-pay | Admitting: Nurse Practitioner

## 2018-09-20 DIAGNOSIS — R928 Other abnormal and inconclusive findings on diagnostic imaging of breast: Secondary | ICD-10-CM

## 2018-09-20 DIAGNOSIS — N6489 Other specified disorders of breast: Secondary | ICD-10-CM

## 2018-09-24 ENCOUNTER — Ambulatory Visit
Admission: RE | Admit: 2018-09-24 | Discharge: 2018-09-24 | Disposition: A | Discharge status: Home / Self Care | Payer: BC Managed Care – PPO | Source: Ambulatory Visit | Attending: Nurse Practitioner | Admitting: Nurse Practitioner

## 2018-09-24 ENCOUNTER — Ambulatory Visit: Admission: RE | Admit: 2018-09-24 | Payer: BC Managed Care – PPO | Source: Ambulatory Visit

## 2018-09-24 ENCOUNTER — Other Ambulatory Visit: Payer: Self-pay

## 2018-09-24 DIAGNOSIS — N6489 Other specified disorders of breast: Secondary | ICD-10-CM

## 2019-07-17 ENCOUNTER — Emergency Department (HOSPITAL_COMMUNITY)
Admission: EM | Admit: 2019-07-17 | Discharge: 2019-07-18 | Disposition: A | Discharge status: Home / Self Care | Payer: No Typology Code available for payment source | Attending: Emergency Medicine | Admitting: Emergency Medicine

## 2019-07-17 ENCOUNTER — Encounter (HOSPITAL_COMMUNITY): Payer: Self-pay | Admitting: Emergency Medicine

## 2019-07-17 ENCOUNTER — Other Ambulatory Visit: Payer: Self-pay

## 2019-07-17 ENCOUNTER — Emergency Department (HOSPITAL_COMMUNITY): Payer: No Typology Code available for payment source

## 2019-07-17 DIAGNOSIS — Y92811 Bus as the place of occurrence of the external cause: Secondary | ICD-10-CM | POA: Diagnosis not present

## 2019-07-17 DIAGNOSIS — W19XXXA Unspecified fall, initial encounter: Secondary | ICD-10-CM

## 2019-07-17 DIAGNOSIS — Z79899 Other long term (current) drug therapy: Secondary | ICD-10-CM | POA: Diagnosis not present

## 2019-07-17 DIAGNOSIS — Y990 Civilian activity done for income or pay: Secondary | ICD-10-CM | POA: Insufficient documentation

## 2019-07-17 DIAGNOSIS — M25551 Pain in right hip: Secondary | ICD-10-CM | POA: Diagnosis not present

## 2019-07-17 DIAGNOSIS — E119 Type 2 diabetes mellitus without complications: Secondary | ICD-10-CM | POA: Insufficient documentation

## 2019-07-17 DIAGNOSIS — R52 Pain, unspecified: Secondary | ICD-10-CM

## 2019-07-17 DIAGNOSIS — S0101XA Laceration without foreign body of scalp, initial encounter: Secondary | ICD-10-CM | POA: Insufficient documentation

## 2019-07-17 DIAGNOSIS — S0990XA Unspecified injury of head, initial encounter: Secondary | ICD-10-CM | POA: Insufficient documentation

## 2019-07-17 DIAGNOSIS — Y939 Activity, unspecified: Secondary | ICD-10-CM | POA: Insufficient documentation

## 2019-07-17 DIAGNOSIS — Z23 Encounter for immunization: Secondary | ICD-10-CM | POA: Insufficient documentation

## 2019-07-17 DIAGNOSIS — I10 Essential (primary) hypertension: Secondary | ICD-10-CM | POA: Diagnosis not present

## 2019-07-17 DIAGNOSIS — W010XXA Fall on same level from slipping, tripping and stumbling without subsequent striking against object, initial encounter: Secondary | ICD-10-CM | POA: Insufficient documentation

## 2019-07-17 DIAGNOSIS — S7011XA Contusion of right thigh, initial encounter: Secondary | ICD-10-CM
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
## 2019-07-18 ENCOUNTER — Emergency Department (HOSPITAL_COMMUNITY): Payer: Self-pay

## 2019-07-18 ENCOUNTER — Emergency Department (HOSPITAL_COMMUNITY): Payer: No Typology Code available for payment source

## 2019-07-18 MED ORDER — TETANUS-DIPHTH-ACELL PERTUSSIS 5-2.5-18.5 LF-MCG/0.5 IM SUSP
0.5000 mL | Freq: Once | INTRAMUSCULAR | Status: AC
  Administered 2019-07-18: 0.5 mL via INTRAMUSCULAR
  Filled 2019-07-18: qty 0.5

## 2019-07-18 MED ORDER — LIDOCAINE-EPINEPHRINE (PF) 2 %-1:200000 IJ SOLN
10.0000 mL | Freq: Once | INTRAMUSCULAR | Status: AC
  Administered 2019-07-18: 10 mL via INTRADERMAL
  Filled 2019-07-18: qty 20

## 2019-07-18 MED ORDER — ACETAMINOPHEN 500 MG PO TABS
1000.0000 mg | ORAL_TABLET | Freq: Once | ORAL | Status: AC
  Administered 2019-07-18: 1000 mg via ORAL
  Filled 2019-07-18: qty 2

## 2019-07-18 MED ORDER — ACETAMINOPHEN 500 MG PO TABS
1000.0000 mg | ORAL_TABLET | Freq: Three times a day (TID) | ORAL | 0 refills | Status: AC
Start: 2019-07-18 — End: 2019-07-23

## 2019-07-18 NOTE — ED Notes (Signed)
Patient verbalizes understanding of discharge instructions. Opportunity for questioning and answers were provided. Armband removed by staff, pt discharged from ED in wheelchair to home.   
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
## 2019-07-18 NOTE — ED Notes (Signed)
Pt transported to XRAY at this time.

## 2019-07-29 ENCOUNTER — Encounter: Payer: Self-pay | Admitting: Gastroenterology

## 2019-09-05 ENCOUNTER — Other Ambulatory Visit: Payer: Self-pay

## 2019-09-05 ENCOUNTER — Encounter: Payer: Self-pay | Admitting: Gastroenterology

## 2019-09-05 ENCOUNTER — Ambulatory Visit (AMBULATORY_SURGERY_CENTER): Payer: Self-pay | Admitting: *Deleted

## 2019-09-05 VITALS — Ht 63.0 in | Wt 188.0 lb

## 2019-09-05 DIAGNOSIS — Z01818 Encounter for other preprocedural examination: Secondary | ICD-10-CM

## 2019-09-05 DIAGNOSIS — Z1211 Encounter for screening for malignant neoplasm of colon: Secondary | ICD-10-CM

## 2019-09-05 DIAGNOSIS — Z86010 Personal history of colonic polyps: Secondary | ICD-10-CM

## 2019-09-05 NOTE — Progress Notes (Signed)
Patient is here in-person for PV. Patient denies any allergies to eggs or soy. Patient denies any problems with anesthesia/sedation. Patient denies any oxygen use at home. Patient denies taking any diet/weight loss medications or blood thinners. Patient is not being treated for MRSA or C-diff. Patient is aware of our care-partner policy and Covid-19 safety protocol. EMMI education assisgned to the patient for the procedure, this was explained and instructions given to patient. Pt aware to stop IRON 5 days before exam.  COVID-19 screening test is on 7/7, the pt is aware.

## 2019-09-16 ENCOUNTER — Other Ambulatory Visit: Payer: Self-pay | Admitting: Family Medicine

## 2019-09-16 DIAGNOSIS — Z1231 Encounter for screening mammogram for malignant neoplasm of breast: Secondary | ICD-10-CM

## 2019-09-17 ENCOUNTER — Other Ambulatory Visit: Payer: Self-pay | Admitting: Gastroenterology

## 2019-09-17 ENCOUNTER — Ambulatory Visit (INDEPENDENT_AMBULATORY_CARE_PROVIDER_SITE_OTHER): Payer: BC Managed Care – PPO

## 2019-09-17 DIAGNOSIS — Z1159 Encounter for screening for other viral diseases: Secondary | ICD-10-CM

## 2019-09-17 LAB — SARS CORONAVIRUS 2 (TAT 6-24 HRS): SARS Coronavirus 2: NEGATIVE

## 2019-09-19 ENCOUNTER — Ambulatory Visit (AMBULATORY_SURGERY_CENTER): Payer: BC Managed Care – PPO | Admitting: Gastroenterology

## 2019-09-19 ENCOUNTER — Encounter: Payer: Self-pay | Admitting: Gastroenterology

## 2019-09-19 ENCOUNTER — Other Ambulatory Visit: Payer: Self-pay

## 2019-09-19 VITALS — BP 124/77 | HR 74 | Temp 97.3°F | Resp 16 | Ht 63.0 in | Wt 188.0 lb

## 2019-09-19 DIAGNOSIS — Z86010 Personal history of colonic polyps: Secondary | ICD-10-CM

## 2019-09-19 MED ORDER — SODIUM CHLORIDE 0.9 % IV SOLN: 500.0000 mL | Freq: Once | INTRAVENOUS | Status: DC

## 2019-09-19 NOTE — Patient Instructions (Signed)
Your next colonoscopy should occur in 10 years.    You may resume your previous diet and medication schedule.  Thank you for allowing us to care for you today!!!   YOU HAD AN ENDOSCOPIC PROCEDURE TODAY AT THE [facility]:   Refer to the procedure report that was given to you for any specific questions about what was found during the examination.  If the procedure report does not answer your questions, please call your gastroenterologist to clarify.  If you requested that your care partner not be given the details of your procedure findings, then the procedure report has been included in a sealed envelope for you to review at your convenience later.  YOU SHOULD EXPECT: Some feelings of bloating in the abdomen. Passage of more gas than usual.  Walking can help get rid of the air that was put into your GI tract during the procedure and reduce the bloating. If you had a lower endoscopy (such as a colonoscopy or flexible sigmoidoscopy) you may notice spotting of blood in your stool or on the toilet paper. If you underwent a bowel prep for your procedure, you may not have a normal bowel movement for a few days.  Please Note:  You might notice some irritation and congestion in your nose or some drainage.  This is from the oxygen used during your procedure.  There is no need for concern and it should clear up in a day or so.  SYMPTOMS TO REPORT IMMEDIATELY:   Following lower endoscopy (colonoscopy or flexible sigmoidoscopy):  Excessive amounts of blood in the stool  Significant tenderness or worsening of abdominal pains  Swelling of the abdomen that is new, acute  Fever of 100F or higher  For urgent or emergent issues, a gastroenterologist can be reached at any hour by calling [phone]. Do not use MyChart messaging for urgent concerns.    DIET:  We do recommend a small meal at first, but then you may proceed to your regular diet.  Drink plenty of fluids but you should avoid  alcoholic beverages for 24 hours.  ACTIVITY:  You should plan to take it easy for the rest of today and you should NOT DRIVE or use heavy machinery until tomorrow (because of the sedation medicines used during the test).    FOLLOW UP: Our staff will call the number listed on your records 48-72 hours following your procedure to check on you and address any questions or concerns that you may have regarding the information given to you following your procedure. If we do not reach you, we will leave a message.  We will attempt to reach you two times.  During this call, we will ask if you have developed any symptoms of COVID 19. If you develop any symptoms (ie: fever, flu-like symptoms, shortness of breath, cough etc.) before then, please call [phone].  If you test positive for Covid 19 in the 2 weeks post procedure, please call and report this information to us.    If any biopsies were taken you will be contacted by phone or by letter within the next 1-3 weeks.  Please call us at [phone] if you have not heard about the biopsies in 3 weeks.    SIGNATURES/CONFIDENTIALITY: You and/or your care partner have signed paperwork which will be entered into your electronic medical record.  These signatures attest to the fact that that the information above on your After Visit Summary has been reviewed and is understood.  Full   responsibility of the confidentiality of this discharge information lies with you and/or your care-partner. 

## 2019-09-19 NOTE — Progress Notes (Signed)
VS by CW  Pt's states no medical or surgical changes since previsit or office visit.  

## 2019-09-19 NOTE — Op Note (Signed)
[facility] Patient Name: Katrina Lawson Procedure Date: 09/19/2019 7:53 AM MRN: [id number] Endoscopist: Rachael Fee , MD Age: 59 Referring MD:  Date of Birth: 06-Apr-1960 Gender: Female Account #: [id number] Procedure:                Colonoscopy Indications:              High risk colon cancer surveillance: Personal                            history of colonic polyps; Colonoscopy 2011 single                            subCM SSA removed. Medicines:                Monitored Anesthesia Care Procedure:                Pre-Anesthesia Assessment:                           - Prior to the procedure, a History and Physical                            was performed, and patient medications and                            allergies were reviewed. The patient's tolerance of                            previous anesthesia was also reviewed. The risks                            and benefits of the procedure and the sedation                            options and risks were discussed with the patient.                            All questions were answered, and informed consent                            was obtained. Prior Anticoagulants: The patient has                            taken no previous anticoagulant or antiplatelet                            agents. ASA Grade Assessment: II - A patient with                            mild systemic disease. After reviewing the risks                            and benefits, the patient was deemed in  satisfactory condition to undergo the procedure.                           After obtaining informed consent, the colonoscope                            was passed under direct vision. Throughout the                            procedure, the patient's blood pressure, pulse, and                            oxygen saturations were monitored continuously. The                            Colonoscope was introduced through the  anus and                            advanced to the the cecum, identified by                            appendiceal orifice and ileocecal valve. The                            colonoscopy was performed without difficulty. The                            patient tolerated the procedure well. The quality                            of the bowel preparation was good. The ileocecal                            valve, appendiceal orifice, and rectum were                            photographed. Scope In: 8:00:28 AM Scope Out: 8:18:31 AM Scope Withdrawal Time: 0 hours 13 minutes 35 seconds  Total Procedure Duration: 0 hours 18 minutes 3 seconds  Findings:                 The entire examined colon appeared normal on direct                            and retroflexion views. Complications:            No immediate complications. Estimated blood loss:                            None. Estimated Blood Loss:     Estimated blood loss: none. Impression:               - The entire examined colon is normal on direct and                            retroflexion views.                           -  No polyps or cancers. Recommendation:           - Patient has a contact number available for                            emergencies. The signs and symptoms of potential                            delayed complications were discussed with the                            patient. Return to normal activities tomorrow.                            Written discharge instructions were provided to the                            patient.                           - Resume previous diet.                           - Continue present medications.                           - Repeat colonoscopy in 10 years for screening. Rachael Fee, MD 09/19/2019 8:25:12 AM This report has been signed electronically.

## 2019-09-19 NOTE — Progress Notes (Signed)
PT taken to PACU. Monitors in place. VSS. Report given to RN. 

## 2019-09-23 ENCOUNTER — Telehealth: Payer: Self-pay

## 2019-09-23 NOTE — Telephone Encounter (Signed)
°  Follow up Call-  Call back number 09/19/2019  Post procedure Call Back phone  # [phone]  Permission to leave phone message Yes  Some recent data might be hidden     Patient questions:  Do you have a fever, pain , or abdominal swelling? No. Pain Score  0 *  Have you tolerated food without any problems? Yes.    Have you been able to return to your normal activities? Yes.    Do you have any questions about your discharge instructions: Diet   No. Medications  No. Follow up visit  No.  Do you have questions or concerns about your Care? No.  Actions: * If pain score is 4 or above: No action needed, pain <4. 1. Have you developed a fever since your procedure? no  2.   Have you had an respiratory symptoms (SOB or cough) since your procedure? no  3.   Have you tested positive for COVID 19 since your procedure no  4.   Have you had any family members/close contacts diagnosed with the COVID 19 since your procedure?  no   If yes to any of these questions please route to Laverna Peace, RN and Charlett Lango, RN

## 2019-09-23 NOTE — Telephone Encounter (Signed)
1st follow up call made.  NALM 

## 2019-10-08 ENCOUNTER — Ambulatory Visit
Admission: RE | Admit: 2019-10-08 | Discharge: 2019-10-08 | Disposition: A | Discharge status: Home / Self Care | Payer: BC Managed Care – PPO | Source: Ambulatory Visit | Attending: Family Medicine | Admitting: Family Medicine

## 2019-10-08 ENCOUNTER — Other Ambulatory Visit: Payer: Self-pay

## 2019-10-08 DIAGNOSIS — Z1231 Encounter for screening mammogram for malignant neoplasm of breast: Secondary | ICD-10-CM

## 2020-09-15 ENCOUNTER — Ambulatory Visit: Payer: BC Managed Care – PPO | Admitting: Allergy and Immunology

## 2020-09-15 ENCOUNTER — Encounter: Payer: Self-pay | Admitting: Allergy and Immunology

## 2020-09-15 ENCOUNTER — Other Ambulatory Visit: Payer: Self-pay

## 2020-09-15 VITALS — BP 124/82 | HR 100 | Resp 18 | Ht 62.5 in | Wt 194.2 lb

## 2020-09-15 DIAGNOSIS — L500 Allergic urticaria: Secondary | ICD-10-CM | POA: Diagnosis not present

## 2020-09-15 DIAGNOSIS — J040 Acute laryngitis: Secondary | ICD-10-CM | POA: Diagnosis not present

## 2020-09-15 DIAGNOSIS — J189 Pneumonia, unspecified organism: Secondary | ICD-10-CM

## 2020-09-15 DIAGNOSIS — T783XXA Angioneurotic edema, initial encounter: Secondary | ICD-10-CM

## 2020-09-15 MED ORDER — FAMOTIDINE 20 MG PO TABS: 20.0000 mg | ORAL_TABLET | Freq: Two times a day (BID) | ORAL | 5 refills | Status: DC

## 2020-09-15 MED ORDER — AZITHROMYCIN 500 MG PO TABS: ORAL_TABLET | ORAL | 0 refills | Status: DC

## 2020-09-15 NOTE — Progress Notes (Signed)
[place] - [place] - [place]   Dear Lonie Peak,  Thank you for referring Katrina Lawson to the [facility] [place] on 09/15/2020.   Below is a summation of this patient's evaluation and recommendations.  Thank you for your referral. I will keep you informed about this patient's response to treatment.   If you have any questions please do not hesitate to contact me.   Sincerely,  Jessica Priest, MD Allergy / Immunology [facility]   ______________________________________________________________________    NEW PATIENT NOTE  Referring Provider: Lonie Peak, PA-C Primary Provider: Ailene Ravel, MD Date of office visit: 09/15/2020    Subjective:   Chief Complaint:  Katrina Lawson (DOB: October 26, 1960) is a 60 y.o. female who presents to the clinic on 09/15/2020 with a chief complaint of Urticaria .     HPI: Katrina Lawson presents to this clinic in evaluation of hives.  Apparently 9 weeks ago she developed an unrelenting cough where she almost vomited and had posttussive urination.  Apparently she performed a at home COVID test which was negative.  Within days she developed diffuse urticaria across her entire body described as red raised itchy lesions and "whelps" which were areas that were not red but were raised.  The only other associated systemic symptom with this initial presentation was the development of raspy voice and throat clearing.  Since that event she has had daily red raised areas across her body that are itchy and has required the administration of a systemic steroid on 4 different occasions.  She does appear to respond somewhat to the administration of a systemic steroid.  She does describe these areas where her entire arm becomes red and hot.  She was apparently treated with amoxicillin for 10 days about 3 weeks into her episode for a possible sinus  infection.  Currently, she continues to have hives and this morning she woke up with some facial swelling which correlated with discontinuation of her medical plan to control her hives.  She still continues to have cough although this is definitely improved but she still has very raspy voice.  She has had blood test performed in investigation of this issue on 03 Aug 2020 which identified WBC 14, eosinophil 400, platelet 563, creatinine 0.72, negative alpha gal panel.  She does not have any significant upper airway symptoms, anosmia, headaches, or other associated systemic or constitutional symptoms beyond those described above.  She does not really note an obvious provoking factor giving rise to this immune activation.  She has not really had a significant environmental change.  She did start a herbal tea about 4 months ago from [employer].  She has not started any other new supplements or health foods or energy boosters or new medications.  She did have COVID in January 2022 manifested for the most part as of fatigue and constitutional symptoms which is very short-lived without any long-term sequela.  She has not been immunized for COVID.  Past Medical History:  Diagnosis Date   Anxiety    Diabetes mellitus without complication (HCC)    Hyperlipidemia    pt denies   Hypertension    pt denies   Low iron    Urticaria     Past Surgical History:  Procedure Laterality Date   CERVICAL ABLATION     CHOLECYSTECTOMY  2018   HERNIA REPAIR     ROUX-EN-Y PROCEDURE  2017   TUBAL LIGATION      Allergies as of 09/15/2020       Reactions   Nsaids Hives   Only when taken in large volumes.        Medication List    ferrous sulfate 325 (65 FE) MG EC tablet Take 325 mg by mouth 3 (three) times daily with meals.   hydrOXYzine 25 MG tablet Commonly known as: ATARAX/VISTARIL Take 25 mg by mouth 4 (four) times daily as needed.   Magnesium 200 MG Chew Chew by mouth.   montelukast 10 MG  tablet Commonly known as: SINGULAIR Take 10 mg by mouth daily.   multivitamin with minerals tablet Take 1 tablet by mouth daily.   Omega 3 1000 MG Caps Take 2 capsules by mouth 2 (two) times daily.   PROBIOTIC-PREBIOTIC PO Take by mouth.   SUPER ENZYMES PO Take by mouth.   venlafaxine XR 150 MG 24 hr capsule Commonly known as: EFFEXOR-XR Take 150 mg by mouth daily with breakfast.   VITAMIN C PO Take by mouth.   VITAMIN D3 PO Take by mouth.   zinc gluconate 50 MG tablet Take 50 mg by mouth daily.        Review of systems negative except as noted in HPI / PMHx or noted below:  Review of Systems  Constitutional: Negative.   HENT: Negative.    Eyes: Negative.   Respiratory: Negative.    Cardiovascular: Negative.   Gastrointestinal: Negative.   Genitourinary: Negative.   Musculoskeletal: Negative.   Skin: Negative.   Neurological: Negative.   Endo/Heme/Allergies: Negative.   Psychiatric/Behavioral: Negative.     Family History  Problem Relation Age of Onset   Colon cancer Neg Hx    Colon polyps Neg Hx    Esophageal cancer Neg Hx    Rectal cancer Neg Hx    Stomach cancer Neg Hx     Social History   Socioeconomic History   Marital status: Widowed    Spouse name: Not on file   Number of children: Not on file   Years of education: Not on file   Highest education level: Not on file  Occupational History   Not on file  Tobacco Use   Smoking status: Never   Smokeless tobacco: Never  Vaping Use   Vaping Use: Never used  Substance and Sexual Activity   Alcohol use: No   Drug use: No   Sexual activity: Not on file  Other Topics Concern   Not on file  Social History Narrative   Not on file   Environmental and Social history  Lives in a house with a dry environment, no animals located inside the household, carpet in the bedroom, plastic on the bed, plastic on the pillow, and no smoking ongoing with inside the household.  She is a Architectural technologist  and bus driver.  Objective:   Vitals:   09/15/20 0854  BP: 124/82  Pulse: 100  Resp: 18  SpO2: 98%   Height: 5' 2.5" (158.8 cm) Weight: 194 lb 3.2 oz (88.1 kg)  Physical Exam Constitutional:      Appearance: She is not diaphoretic.     Comments: Coughing, oscillating raspy voice  HENT:     Head: Normocephalic. No right periorbital erythema or left periorbital erythema.     Right Ear: Tympanic membrane, ear canal and external ear normal.     Left Ear: Tympanic membrane, ear canal and external ear normal.     Nose: Nose  normal. No mucosal edema or rhinorrhea.     Mouth/Throat:     Pharynx: No oropharyngeal exudate.  Eyes:     General: Lids are normal.     Conjunctiva/sclera: Conjunctivae normal.     Pupils: Pupils are equal, round, and reactive to light.  Neck:     Thyroid: No thyromegaly.     Trachea: Trachea normal. No tracheal deviation.  Cardiovascular:     Rate and Rhythm: Normal rate and regular rhythm.     Heart sounds: Normal heart sounds, S1 normal and S2 normal. No murmur heard. Pulmonary:     Effort: Pulmonary effort is normal. No respiratory distress.     Breath sounds: No stridor. No wheezing or rales.  Chest:     Chest wall: No tenderness.  Abdominal:     General: There is no distension.     Palpations: Abdomen is soft. There is no mass.     Tenderness: There is no abdominal tenderness. There is no guarding or rebound.  Musculoskeletal:        General: No tenderness.  Lymphadenopathy:     Head:     Right side of head: No tonsillar adenopathy.     Left side of head: No tonsillar adenopathy.     Cervical: No cervical adenopathy.  Skin:    Coloration: Skin is not pale.     Findings: Rash (Multiple blanching patchy erythematous macules trunk, slight upper lip edema.) present. No erythema.     Nails: There is no clubbing.  Neurological:     Mental Status: She is alert.    Diagnostics: Allergy skin tests were performed.  She did not demonstrate any  hypersensitivity against a screening panel of aeroallergens or foods.  Assessment and Plan:    1. Allergic urticaria   2. Angioedema, initial encounter   3. Pneumonitis   4. Laryngitis     1.  Allergen avoidance measures?  2.  Obtain a chest x-ray for pneumonitis  3.  Obtain blood tests - CBC w/d, LFT panel, TSH, FT4, thyroid peroxidase, ANA w/R, sed, CRP  4. Every day use the following:  A. Cetirizine 1-2 tablet 2 times per day (MAX=40mg /day) B. Famotidine 20 mg - 1 tablet 2 times per day C. Montelukast 10 mg - 1 tablet 1 time per day  5. Azithromycin 500 mg - 1 tablet 1 time per day for 3 days  6. Stop fish oil and tea for now  7. Further evaluation and treatment??? Yes, if recurrent issues.  8. Return to clinic in 3 weeks or earlier if problem.  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  Given the fact that she has significant respiratory tract symptoms I am going to assume that she has been infected possibly with mycoplasma giving rise to some low-grade pneumonitis and treat her with azithromycin.  We will also investigate for other etiologic factors contributing to her issues by checking the blood test noted above.  I am going to have her discontinue her herbal tea and fish oil at this point.  She will use a collection of H1 and H2 receptor blocker and a leukotriene modifier to hopefully prevent her from developing significant problems with her skin and hopefully prevent her from requiring more systemic steroids to get her immune system under better control.  I will see her back in this clinic in 3 weeks or earlier if there is a problem.   Katrina Lawson  J. Lucie Leather, MD Allergy / Immunology [facility] [place]

## 2020-09-15 NOTE — Patient Instructions (Addendum)
  1.  Allergen avoidance measures?  2.  Obtain a chest x-ray for pneumonitis  3.  Obtain blood tests - CBC w/d, LFT panel, TSH, FT4, thyroid peroxidase, ANA w/R, sed, CRP  4. Every day use the following:  A. Cetirizine 1-2 tablet 2 times per day (MAX=40mg /day) B. Famotidine 20 mg - 1 tablet 2 times per day C. Montelukast 10 mg - 1 tablet 1 time per day  5. Azithromycin 500 mg - 1 tablet 1 time per day for 3 days  6. Stop fish oil and tea for now  7. Further evaluation and treatment??? Yes, if recurrent issues.  8. Return to clinic in 3 weeks or earlier if problem.

## 2020-09-16 ENCOUNTER — Encounter: Payer: Self-pay | Admitting: Allergy and Immunology

## 2020-09-16 ENCOUNTER — Telehealth: Payer: Self-pay

## 2020-09-16 ENCOUNTER — Telehealth: Payer: Self-pay | Admitting: Allergy and Immunology

## 2020-09-16 NOTE — Telephone Encounter (Signed)
Called Katrina Lawson to let her know about seeing the eye doctor and she really thinks it is just pink eye and is requesting eye drops for pink eye.  She has had pink eye in the past and is familiar with its symptoms. Please advise.

## 2020-09-16 NOTE — Telephone Encounter (Signed)
I think would be best for her to use Systane multiple times per day to keep your eye lubricated and lets get someone to look at her eye whether it be in our clinic or someone else's clinic.

## 2020-09-16 NOTE — Telephone Encounter (Signed)
What drops did she use for 'pink eye' in the past?

## 2020-09-16 NOTE — Telephone Encounter (Signed)
Patient says that the eye also burns and itches, hurts, and is blurry. When she woke up, the matting on her eye was like "pure snot."  Please advise.

## 2020-09-16 NOTE — Telephone Encounter (Signed)
Left message to call the office.

## 2020-09-16 NOTE — Telephone Encounter (Signed)
Please see if we can get her into [facility] today or tomorrow for acute unilateral conjunctivitis in a immunosuppressed patient (steroids)

## 2020-09-16 NOTE — Telephone Encounter (Signed)
I spoke to patient and she states she does not remember the name of the drops because it's been a long time. She just knows it was an antibiotic drop for "pink eye"?

## 2020-09-16 NOTE — Telephone Encounter (Signed)
Called and informed patient that her complete abdominal ultrasound is scheduled at [facility] this Saturday, July 9th, needing to check-in at 8:30am at the Emergency Room.  I informed her that she cannot have anything to eat or drink starting 6 hours prior to her appointment. Will fax order form to [facility]. Note:  No prior authorization needed per insurance. Call reference # [id number]

## 2020-09-16 NOTE — Telephone Encounter (Signed)
Patient states she woke up this morning with her right eye matted shut, could be a possible pink eye. She is wondering if eye drops could be sent in for her.

## 2020-09-17 LAB — HEPATIC FUNCTION PANEL
ALT: 51 IU/L — ABNORMAL HIGH (ref 0–32)
AST: 55 IU/L — ABNORMAL HIGH (ref 0–40)
Albumin: 4.2 g/dL (ref 3.8–4.8)
Alkaline Phosphatase: 130 IU/L — ABNORMAL HIGH (ref 44–121)
Bilirubin Total: <0.2 mg/dL (ref 0.0–1.2)
Bilirubin, Direct: <0.1 mg/dL (ref 0.00–0.40)
Total Protein: 7.5 g/dL (ref 6.0–8.5)

## 2020-09-17 LAB — ANA W/REFLEX IF POSITIVE
Anti JO-1: <0.2 AI (ref 0.0–0.9)
Anti Nuclear Antibody (ANA): POSITIVE — AB
Centromere Ab Screen: 5 AI — ABNORMAL HIGH (ref 0.0–0.9)
Chromatin Ab SerPl-aCnc: <0.2 AI (ref 0.0–0.9)
ENA RNP Ab: <0.2 AI (ref 0.0–0.9)
ENA SM Ab Ser-aCnc: <0.2 AI (ref 0.0–0.9)
ENA SSA (RO) Ab: <0.2 AI (ref 0.0–0.9)
ENA SSB (LA) Ab: <0.2 AI (ref 0.0–0.9)
Scleroderma (Scl-70) (ENA) Antibody, IgG: <0.2 AI (ref 0.0–0.9)
dsDNA Ab: <1 IU/mL (ref 0–9)

## 2020-09-17 LAB — CBC WITH DIFFERENTIAL/PLATELET
Basophils Absolute: 0 10*3/uL (ref 0.0–0.2)
Basos: 0 %
EOS (ABSOLUTE): 0.3 10*3/uL (ref 0.0–0.4)
Eos: 3 %
Hematocrit: 34.7 % (ref 34.0–46.6)
Hemoglobin: 10.6 g/dL — ABNORMAL LOW (ref 11.1–15.9)
Immature Grans (Abs): 0.1 10*3/uL (ref 0.0–0.1)
Immature Granulocytes: 1 %
Lymphocytes Absolute: 3 10*3/uL (ref 0.7–3.1)
Lymphs: 25 %
MCH: 21.9 pg — ABNORMAL LOW (ref 26.6–33.0)
MCHC: 30.5 g/dL — ABNORMAL LOW (ref 31.5–35.7)
MCV: 72 fL — ABNORMAL LOW (ref 79–97)
Monocytes Absolute: 0.8 10*3/uL (ref 0.1–0.9)
Monocytes: 6 %
Neutrophils Absolute: 7.6 10*3/uL — ABNORMAL HIGH (ref 1.4–7.0)
Neutrophils: 65 %
Platelets: 459 10*3/uL — ABNORMAL HIGH (ref 150–450)
RBC: 4.85 x10E6/uL (ref 3.77–5.28)
RDW: 16 % — ABNORMAL HIGH (ref 11.7–15.4)
WBC: 11.7 10*3/uL — ABNORMAL HIGH (ref 3.4–10.8)

## 2020-09-17 LAB — TSH+FREE T4
Free T4: 0.93 ng/dL (ref 0.82–1.77)
TSH: 3.4 u[IU]/mL (ref 0.450–4.500)

## 2020-09-17 LAB — THYROID PEROXIDASE ANTIBODY: Thyroperoxidase Ab SerPl-aCnc: <8 IU/mL (ref 0–34)

## 2020-09-17 LAB — SEDIMENTATION RATE: Sed Rate: 32 mm/hr (ref 0–40)

## 2020-09-17 LAB — C-REACTIVE PROTEIN: CRP: 36 mg/L — ABNORMAL HIGH (ref 0–10)

## 2020-09-17 NOTE — Telephone Encounter (Signed)
Sent MyChart message to patient this morning informing her of Dr. Kathyrn Lass message and letting her know to call us if she would like to come in to be seen or she can try her eye doctor.

## 2020-09-24 ENCOUNTER — Other Ambulatory Visit: Payer: Self-pay | Admitting: Allergy and Immunology

## 2020-09-24 ENCOUNTER — Encounter: Payer: Self-pay | Admitting: *Deleted

## 2020-09-24 DIAGNOSIS — R7989 Other specified abnormal findings of blood chemistry: Secondary | ICD-10-CM

## 2020-09-24 DIAGNOSIS — D509 Iron deficiency anemia, unspecified: Secondary | ICD-10-CM

## 2020-09-28 LAB — SOLUBLE LIVER AG (IGG AB): Anti-SLA, IgG: 1.4 units (ref 0.0–20.0)

## 2020-09-28 LAB — ANTI-SMOOTH MUSCLE ANTIBODY, IGG: Smooth Muscle Ab: 14 Units (ref 0–19)

## 2020-09-28 LAB — HEPATITIS C ANTIBODY: Hep C Virus Ab: 0.5 s/co ratio (ref 0.0–0.9)

## 2020-09-28 LAB — HBV SCREENING AND DIAGNOSIS
Hep B Core Total Ab: NEGATIVE
Hep B Surface Ab, Qual: NONREACTIVE
Hepatitis B Surface Ag: NEGATIVE

## 2020-09-28 LAB — HEPATITIS A ANTIBODY, IGM: Hep A IgM: NEGATIVE

## 2020-09-28 LAB — MITOCHONDRIAL ANTIBODIES: Mitochondrial Ab: <20 Units (ref 0.0–20.0)

## 2020-09-28 LAB — GAMMA GT: GGT: 219 IU/L — ABNORMAL HIGH (ref 0–60)

## 2020-09-28 LAB — ANTI-MICROSOMAL ANTIBODY LIVER / KIDNEY: LKM1 Ab: 1 Units (ref 0.0–20.0)

## 2020-09-28 LAB — AMYLASE: Amylase: 112 U/L — ABNORMAL HIGH (ref 31–110)

## 2020-09-28 LAB — FERRITIN: Ferritin: 29 ng/mL (ref 15–150)

## 2020-10-08 ENCOUNTER — Other Ambulatory Visit: Payer: Self-pay | Admitting: Family Medicine

## 2020-10-08 DIAGNOSIS — Z1231 Encounter for screening mammogram for malignant neoplasm of breast: Secondary | ICD-10-CM

## 2020-10-14 ENCOUNTER — Ambulatory Visit: Payer: BC Managed Care – PPO | Admitting: Allergy and Immunology

## 2020-10-14 ENCOUNTER — Encounter: Payer: Self-pay | Admitting: Allergy and Immunology

## 2020-10-14 ENCOUNTER — Other Ambulatory Visit: Payer: Self-pay

## 2020-10-14 VITALS — BP 144/82 | HR 100 | Resp 14

## 2020-10-14 DIAGNOSIS — R768 Other specified abnormal immunological findings in serum: Secondary | ICD-10-CM | POA: Diagnosis not present

## 2020-10-14 DIAGNOSIS — L500 Allergic urticaria: Secondary | ICD-10-CM | POA: Diagnosis not present

## 2020-10-14 DIAGNOSIS — T783XXA Angioneurotic edema, initial encounter: Secondary | ICD-10-CM

## 2020-10-14 DIAGNOSIS — D509 Iron deficiency anemia, unspecified: Secondary | ICD-10-CM | POA: Diagnosis not present

## 2020-10-14 DIAGNOSIS — R7989 Other specified abnormal findings of blood chemistry: Secondary | ICD-10-CM

## 2020-10-14 DIAGNOSIS — T783XXD Angioneurotic edema, subsequent encounter: Secondary | ICD-10-CM

## 2020-10-14 NOTE — Patient Instructions (Addendum)
  1.  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 Every day use the following:  A. Cetirizine 1-2 tablet 2 times per day (MAX=40mg /day) B. Famotidine 20 mg - 1 tablet 2 times per day C. Montelukast 10 mg - 1 tablet 1 time per day  3. Oral iron supplements  4. Fatty liver identified on 2017 / 2022 abdominal ultrasound  5. Evaluation with rheumatologist???  6. Not absorbing iron or losing blood???  7. Omalizumab???  8. Return to clinic in 8 weeks or earlier if problem  9. Obtain fall flu vaccine

## 2020-10-14 NOTE — Progress Notes (Signed)
[place] - [place] - [place] - [place] - [place]   Follow-up Note  Referring Provider: Ailene Ravel, MD Primary Provider: Ailene Ravel, MD Date of Office Visit: 10/14/2020  Subjective:   Katrina Lawson (DOB: May 04, 1960) is a 60 y.o. female who [facility] on 10[facility]14/2020 in re-evaluation of the following:  HPI: Katrina Lawson returns to this clinic in reevaluation of immune activation presenting as urticaria and angioedema and a persistent cough and raspy voice.  Her last visit to this clinic was her initial evaluation of 15 September 2020.  She is currently doing much better at this point in time on her combination of H1 and H2 receptor blocker and a leukotriene modifier.  Her hives are much less frequent and much less intense and she has had no episodes of swelling involving any areas or of her body and her breathing is better and her coughing has resolved and her raspy voice has resolved.  This all occurred in the context of not using any systemic steroids.  Allergies as of 10/14/2020       Reactions   Nsaids Hives   Only when taken in large volumes.        Medication List    famotidine 20 MG tablet Commonly known as: PEPCID Take 1 tablet (20 mg total) by mouth 2 (two) times daily.   ferrous sulfate 325 (65 FE) MG EC tablet Take 325 mg by mouth 3 (three) times daily with meals.   hydrOXYzine 25 MG tablet Commonly known as: ATARAX/VISTARIL Take 25 mg by mouth 4 (four) times daily as needed.   L-Glutamine 500 MG Caps Take by mouth in the morning, at noon, and at bedtime.   Magnesium 200 MG Chew Chew by mouth.   montelukast 10 MG tablet Commonly known as: SINGULAIR Take 10 mg by mouth daily.   multivitamin with minerals tablet Take 1 tablet by mouth daily.   NUTRITIONAL SUPPLEMENT PO Take 1 tablet by mouth daily. Detoxication Factors- Phase 1 and Phase 2 Support ( made by [employer])   PROBIOTIC-PREBIOTIC PO Take  by mouth.   SUPER ENZYMES PO Take by mouth.   Turmeric Curcumin 500 MG Caps Take 500 mg by mouth daily. Curcumin Elite   venlafaxine XR 150 MG 24 hr capsule Commonly known as: EFFEXOR-XR Take 150 mg by mouth daily with breakfast.   VITAMIN C PO Take 1,000 mg by mouth daily.   VITAMIN D3 PO Take by mouth.   zinc gluconate 50 MG tablet Take 50 mg by mouth daily.        Past Medical History:  Diagnosis Date   Anxiety    Diabetes mellitus without complication (HCC)    Hyperlipidemia    pt denies   Hypertension    pt denies   Low iron    Urticaria     Past Surgical History:  Procedure Laterality Date   CERVICAL ABLATION     CHOLECYSTECTOMY  2018   HERNIA REPAIR     ROUX-EN-Y PROCEDURE  2017   TUBAL LIGATION      Review of systems negative except as noted in HPI / PMHx or noted below:  Review of Systems  Constitutional: Negative.   HENT: Negative.    Eyes: Negative.   Respiratory: Negative.    Cardiovascular: Negative.   Gastrointestinal: Negative.   Genitourinary: Negative.   Musculoskeletal: Negative.   Skin: Negative.   Neurological: Negative.   Endo/Heme/Allergies: Negative.   Psychiatric/Behavioral: Negative.  Objective:   Vitals:   10/14/20 1044  BP: (!) 144/82  Pulse: 100  Resp: 14  SpO2: 97%          Physical Exam Constitutional:      Appearance: She is not diaphoretic.  HENT:     Head: Normocephalic.     Right Ear: Tympanic membrane, ear canal and external ear normal.     Left Ear: Tympanic membrane, ear canal and external ear normal.     Nose: Nose normal. No mucosal edema or rhinorrhea.     Mouth/Throat:     Pharynx: Uvula midline. No oropharyngeal exudate.  Eyes:     Conjunctiva/sclera: Conjunctivae normal.  Neck:     Thyroid: No thyromegaly.     Trachea: Trachea normal. No tracheal tenderness or tracheal deviation.  Cardiovascular:     Rate and Rhythm: Normal rate and regular rhythm.     Heart sounds: Normal heart  sounds, S1 normal and S2 normal. No murmur heard. Pulmonary:     Effort: No respiratory distress.     Breath sounds: Normal breath sounds. No stridor. No wheezing or rales.  Lymphadenopathy:     Head:     Right side of head: No tonsillar adenopathy.     Left side of head: No tonsillar adenopathy.     Cervical: No cervical adenopathy.  Skin:    Findings: No erythema or rash.     Nails: There is no clubbing.  Neurological:     Mental Status: She is alert.    Diagnostics:   Results of blood tests obtained 15 September 2020 identified alkaline phosphatase 130 U/L, AST 50 5U/L, ALT 50 1U/L, C-reactive protein 36 mg/L, ANA positive with anticentromere 5.0 AI, TSH 3.4 IU/mL, free T4 0.93 NG/DL, thyroid peroxidase antibody less than 8 EU/mL, WBC 11.7, absolute eosinophil 300, absolute lymphocyte 3000, hemoglobin 10.6, MCV 72, platelet 459.  Results of blood tests obtained 27 September 2020 identified negative hepatitis A IgM, negative hepatitis B surface antigen, negative hepatitis C virus antibody, negative anti-SLA at 1.4 U, negative LK M antibody at 1.0 U, negative smooth muscle antibody at 14 U, GGT 219 U/L, amylase 112 U/L, ferritin 29 NG/mL  Results of a chest x-ray obtained 16 September 2020 identified no significant abnormality.  Results of a complete abdominal ultrasound obtained 18 September 2020 identified hepatic steatosis.  Assessment and Plan:   1. Microcytic anemia   2. Allergic urticaria   3. Angioedema, subsequent encounter   4. Anticentromere antibodies present   5. Elevated liver function tests     1.  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 Every day use the following:  A. Cetirizine 1-2 tablet 2 times per day (MAX=40mg /day) B. Famotidine 20 mg - 1 tablet 2 times per day C. Montelukast 10 mg - 1 tablet 1 time per day  3. Oral iron supplements  4. Fatty liver identified on 2017 / 2022 abdominal ultrasound  5. Evaluation with rheumatologist???  6.  Not absorbing iron or losing blood???  7. Omalizumab???  8. Return to clinic in 8 weeks or earlier if problem  9. 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  We will keep her on an H1 and H2 receptor blocker and a leukotriene modifier and make a decision about further evaluation and treatment for her immune activation pending her  response.  She has a markedly positive anticentromere antibody in the context of elevated LFTs with a negative autoimmune hepatitis evaluation and she also has low hemoglobin with a borderline ferritin level.  At this point were not going to refer her to a rheumatologist as she really does not fit any type of clinical criteria associated with the positive anticentromere antibody syndrome.  There is probably no reason for any further evaluation for elevated liver function test as this does appear to be secondary to hepatic steatosis.  I did have a talk with her today about attempting to decrease her body mass to ideal body weight.  And, why she has a hemoglobin of close to 10 is not clearly explained.  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  I have asked her to follow-up with her primary care doctor regarding this issue.  I will see her back in this clinic in 8 weeks or earlier if there is a problem.  Laurette Schimke, MD Allergy / Immunology [facility]

## 2020-10-18 ENCOUNTER — Encounter: Payer: Self-pay | Admitting: Allergy and Immunology

## 2020-11-30 ENCOUNTER — Ambulatory Visit: Payer: BC Managed Care – PPO

## 2020-12-09 ENCOUNTER — Ambulatory Visit: Payer: BC Managed Care – PPO | Admitting: Allergy and Immunology

## 2020-12-20 ENCOUNTER — Other Ambulatory Visit: Payer: Self-pay

## 2020-12-20 ENCOUNTER — Encounter: Payer: Self-pay | Admitting: Allergy and Immunology

## 2020-12-20 ENCOUNTER — Ambulatory Visit: Payer: BC Managed Care – PPO | Admitting: Allergy and Immunology

## 2020-12-20 VITALS — BP 140/82 | HR 102 | Resp 16

## 2020-12-20 DIAGNOSIS — T50905A Adverse effect of unspecified drugs, medicaments and biological substances, initial encounter: Secondary | ICD-10-CM

## 2020-12-20 DIAGNOSIS — T783XXD Angioneurotic edema, subsequent encounter: Secondary | ICD-10-CM

## 2020-12-20 DIAGNOSIS — L500 Allergic urticaria: Secondary | ICD-10-CM | POA: Diagnosis not present

## 2020-12-20 NOTE — Progress Notes (Signed)
[place] [facility] [place] - [place] - [place] - [place]   Follow-up Note  Referring Provider: Ailene Ravel, MD Primary Provider: Ailene Ravel, MD Date of Office Visit: 12/20/2020  Subjective:   Katrina Lawson (DOB: 1960/04/29) is a 60 y.o. female who [facility] in re-evaluation of the following:  HPI: Katrina Lawson returns to this clinic in reevaluation of urticaria and angioedema and LPR.  Her last visit to this clinic was 14 October 2020.  When we last saw her in this clinic she had very good control of her urticaria and angioedema on an H1 and H2 receptor blocker and a leukotriene modifier.  She has continued to have very good control and over the course of the past 2 months she has only had 2 very small outbreaks of urticaria that have lasted less than a day.  She has noticed some sedation while using 20 mg of cetirizine per day.  We also established that she had an iron deficiency anemia and she has been using iron supplements orally and these have not resulted in good control of this issue and she is scheduled to have a iron infusion some point in the future.  Regarding investigation of her fatty liver, we did obtain blood test looking for autoimmune hepatitis which were all negative but she did have a markedly positive anticentromere antibody without symptoms suggesting a disease state associated with this positive anticentromere antibody.  Apparently she went to see a rheumatologist and he felt that there was no need for any further evaluation at this point.  She contracted COVID on 09 December 2020 with predominantly a pharyngitis and laryngitis and some fatigue.  She went to see a doctor in [place] and was given Z-Pak and ivermectin.  Fortunately she has had no long-term sequela as a result of this issue.  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  Allergies as of 12/20/2020       Reactions   Nsaids Hives   Only when taken in large volumes.        Medication List    famotidine 20 MG tablet Commonly known as: PEPCID Take 1 tablet (20 mg total) by mouth 2 (two) times daily.   ferrous sulfate 325 (65 FE) MG EC tablet Take 325 mg by mouth 3 (three) times daily with meals.   hydrOXYzine 25 MG tablet Commonly known as: ATARAX/VISTARIL Take 25 mg by mouth 4 (four) times daily as needed.   L-Glutamine 500 MG Caps Take by mouth in the morning, at noon, and at bedtime.   Magnesium 200 MG Chew Chew by mouth.   montelukast 10 MG tablet Commonly known as: SINGULAIR Take 10 mg by mouth daily.   multivitamin with minerals tablet Take 1 tablet by mouth daily.   NUTRITIONAL SUPPLEMENT PO Take 1 tablet by mouth daily. Detoxication Factors- Phase 1 and Phase 2 Support ( made by [employer])   PROBIOTIC-PREBIOTIC PO Take by mouth.   SUPER ENZYMES PO Take by mouth.   Turmeric Curcumin 500 MG Caps Take 500 mg by mouth daily. Curcumin Elite   venlafaxine XR 150 MG 24 hr capsule Commonly known as: EFFEXOR-XR Take 150 mg by mouth daily with breakfast.   VITAMIN C PO Take 1,000 mg by mouth daily.   VITAMIN D3 PO Take by mouth.   zinc gluconate 50 MG tablet Take  50 mg by mouth daily.    Past Medical History:  Diagnosis Date   Anxiety    Diabetes mellitus without complication (HCC)    Hyperlipidemia    pt denies   Hypertension    pt denies   Low iron    Urticaria     Past Surgical History:  Procedure Laterality Date   CERVICAL ABLATION     CHOLECYSTECTOMY  2018   HERNIA REPAIR     ROUX-EN-Y PROCEDURE  2017   TUBAL LIGATION      Review of systems negative except as noted in HPI / PMHx or noted below:  Review of Systems  Constitutional: Negative.   HENT: Negative.    Eyes: Negative.   Respiratory: Negative.    Cardiovascular: Negative.   Gastrointestinal: Negative.    Genitourinary: Negative.   Musculoskeletal: Negative.   Skin: Negative.   Neurological: Negative.   Endo/Heme/Allergies: Negative.   Psychiatric/Behavioral: Negative.      Objective:   Vitals:   12/20/20 1013  BP: 140/82  Pulse: (!) 102  Resp: 16  SpO2: 99%          Physical Exam Constitutional:      Appearance: She is not diaphoretic.  HENT:     Head: Normocephalic.     Right Ear: Tympanic membrane, ear canal and external ear normal.     Left Ear: Tympanic membrane, ear canal and external ear normal.     Nose: Nose normal. No mucosal edema or rhinorrhea.     Mouth/Throat:     Pharynx: Uvula midline. No oropharyngeal exudate.  Eyes:     Conjunctiva/sclera: Conjunctivae normal.  Neck:     Thyroid: No thyromegaly.     Trachea: Trachea normal. No tracheal tenderness or tracheal deviation.  Cardiovascular:     Rate and Rhythm: Normal rate and regular rhythm.     Heart sounds: Normal heart sounds, S1 normal and S2 normal. No murmur heard. Pulmonary:     Effort: No respiratory distress.     Breath sounds: Normal breath sounds. No stridor. No wheezing or rales.  Lymphadenopathy:     Head:     Right side of head: No tonsillar adenopathy.     Left side of head: No tonsillar adenopathy.     Cervical: No cervical adenopathy.  Skin:    Findings: No erythema or rash.     Nails: There is no clubbing.  Neurological:     Mental Status: She is alert.    Diagnostics: none  Assessment and Plan:   1. Allergic urticaria   2. Angioedema, subsequent encounter   3. Adverse drug effect, initial encounter     1.  Every day use the following:  A. Famotidine 20 mg - 1 tablet 2 times per day B. Montelukast 10 mg - 1 tablet 1 time per day  2. Start to covert Cetirizine to Loratadine:  A. Cetirizine 10 mg PM + loratadine 10 mg in AM x 2 weeks, then... B. Loratadine 10 mg - in AM + PM  3. Return to clinic in January 2023. Taper medications???  I believe that the only issue  we need to attend to today is the fact that Katrina Lawson has developing some sedation while using 20 mg of cetirizine per day.  We will convert her over to loratadine utilizing the plan noted above.  Assuming she does well with this plan we will see her back in this clinic in January 2023 or earlier if there is a problem.  Katrina Katz, MD Allergy / Immunology [place]

## 2020-12-20 NOTE — Patient Instructions (Addendum)
  1.  Every day use the following:  A. Famotidine 20 mg - 1 tablet 2 times per day B. Montelukast 10 mg - 1 tablet 1 time per day  2. Start to covert Cetirizine to Loratadine:  A. Cetirizine 10 mg PM + loratadine 10 mg in AM x 2 weeks, then... B. Loratadine 10 mg - in AM + PM  3. Return to clinic in January 2023. Taper medications???

## 2020-12-21 ENCOUNTER — Encounter: Payer: Self-pay | Admitting: Allergy and Immunology

## 2020-12-29 ENCOUNTER — Other Ambulatory Visit: Payer: Self-pay

## 2020-12-29 ENCOUNTER — Ambulatory Visit
Admission: RE | Admit: 2020-12-29 | Discharge: 2020-12-29 | Disposition: A | Discharge status: Home / Self Care | Payer: BC Managed Care – PPO | Source: Ambulatory Visit | Attending: Family Medicine | Admitting: Family Medicine

## 2020-12-29 DIAGNOSIS — Z1231 Encounter for screening mammogram for malignant neoplasm of breast: Secondary | ICD-10-CM

## 2020-12-31 ENCOUNTER — Encounter: Payer: Self-pay | Admitting: *Deleted

## 2021-01-05 ENCOUNTER — Encounter: Payer: Self-pay | Admitting: *Deleted

## 2021-02-25 ENCOUNTER — Other Ambulatory Visit: Payer: Self-pay | Admitting: Allergy and Immunology

## 2021-04-04 ENCOUNTER — Ambulatory Visit: Payer: BC Managed Care – PPO | Admitting: Allergy and Immunology

## 2021-04-04 ENCOUNTER — Encounter: Payer: Self-pay | Admitting: Allergy and Immunology

## 2021-04-04 ENCOUNTER — Other Ambulatory Visit: Payer: Self-pay

## 2021-04-04 VITALS — BP 142/88 | HR 104 | Resp 14

## 2021-04-04 DIAGNOSIS — T783XXD Angioneurotic edema, subsequent encounter: Secondary | ICD-10-CM

## 2021-04-04 DIAGNOSIS — L500 Allergic urticaria: Secondary | ICD-10-CM

## 2021-04-04 DIAGNOSIS — K219 Gastro-esophageal reflux disease without esophagitis: Secondary | ICD-10-CM | POA: Diagnosis not present

## 2021-04-04 DIAGNOSIS — L989 Disorder of the skin and subcutaneous tissue, unspecified: Secondary | ICD-10-CM

## 2021-04-04 MED ORDER — MOMETASONE FUROATE 0.1 % EX OINT: TOPICAL_OINTMENT | CUTANEOUS | 3 refills | Status: AC

## 2021-04-04 NOTE — Progress Notes (Signed)
[place] - [place] - [place] - [place] - [place]   Follow-up Note  Referring Provider: Ailene Ravel, MD Primary Provider: Ailene Ravel, MD Date of Office Visit: 04/04/2021  Subjective:   Katrina Lawson (DOB: [birth year]) is a 61 y.o. female who [facility]21 in re-evaluation of the following:  HPI: Katrina Lawson returns to this clinic in reevaluation of urticaria and angioedema and LPR.  Her last visit to this clinic was 20 December 2020.  She once again has very good control of her urticaria and angioedema while using a combination of an H1 and H2 receptor blocker and leukotriene modifier.  During her last visit we had to discontinue her cetirizine and start her on loratadine secondary to a sedation issue.  She still has good control of her condition and as well her fatigue is much less at this point.    She did develop these small red spots on her chest over the course of the past month that are itchy.  She has had very little problems with her reflux at this point in time while she continues on her plan.  She did develop a little bit of a head cold recently but that seems to be a minimal issue and is improving.  There are a few other issues for which she is having further evaluation which we identified during her initial evaluation for her urticaria.  She did have a iron infusion for her iron deficiency anemia.  She is now seeing a hepatologist for her elevated liver function test unassociated with autoimmune hepatitis in the context of having a markedly positive anticentromere antibody.  Allergies as of 04/04/2021       Reactions   Tolmetin Other (See Comments)   REACTION: hives   Nsaids Hives   Only when taken in large volumes.        Medication List    famotidine 20 MG tablet Commonly known as: PEPCID TAKE 1 TABLET BY MOUTH TWICE A DAY   ferrous sulfate 325 (65 FE) MG EC tablet Take 325 mg by mouth 3 (three) times  daily with meals.   hydrOXYzine 25 MG tablet Commonly known as: ATARAX Take 25 mg by mouth 4 (four) times daily as needed.   L-Glutamine 500 MG Caps Take by mouth in the morning, at noon, and at bedtime.   Magnesium 200 MG Chew Chew by mouth.   montelukast 10 MG tablet Commonly known as: SINGULAIR Take 10 mg by mouth daily.   multivitamin with minerals tablet Take 1 tablet by mouth daily.   NUTRITIONAL SUPPLEMENT PO Take 1 tablet by mouth daily. Detoxication Factors- Phase 1 and Phase 2 Support ( made by [employer])   ondansetron 4 MG disintegrating tablet Commonly known as: ZOFRAN-ODT Take 4 mg by mouth every 8 (eight) hours as needed.   PROBIOTIC-PREBIOTIC PO Take by mouth.   SUPER ENZYMES PO Take by mouth.   Turmeric Curcumin 500 MG Caps Take 500 mg by mouth daily. Curcumin Elite   venlafaxine XR 150 MG 24 hr capsule Commonly known as: EFFEXOR-XR Take 150 mg by mouth daily with breakfast.   VITAMIN C PO Take 1,000 mg by mouth daily.   VITAMIN D3 PO Take by mouth.   zinc gluconate 50 MG tablet Take 50 mg by mouth daily.    Past Medical History:  Diagnosis Date   Anxiety    Diabetes mellitus without complication (HCC)    Hyperlipidemia  pt denies   Hypertension    pt denies   Low iron    Urticaria     Past Surgical History:  Procedure Laterality Date   CERVICAL ABLATION     CHOLECYSTECTOMY  2018   HERNIA REPAIR     ROUX-EN-Y PROCEDURE  2017   TUBAL LIGATION      Review of systems negative except as noted in HPI / PMHx or noted below:  Review of Systems  Constitutional: Negative.   HENT: Negative.    Eyes: Negative.   Respiratory: Negative.    Cardiovascular: Negative.   Gastrointestinal: Negative.   Genitourinary: Negative.   Musculoskeletal: Negative.   Skin: Negative.   Neurological: Negative.   Endo/Heme/Allergies: Negative.   Psychiatric/Behavioral: Negative.      Objective:   Vitals:   04/04/21 1228  BP:  (!) 142/88  Pulse: (!) 104  Resp: 14  SpO2: 97%          Physical Exam Constitutional:      Appearance: She is not diaphoretic.  HENT:     Head: Normocephalic.     Right Ear: Tympanic membrane, ear canal and external ear normal.     Left Ear: Tympanic membrane, ear canal and external ear normal.     Nose: Nose normal. No mucosal edema or rhinorrhea.     Mouth/Throat:     Pharynx: Uvula midline. No oropharyngeal exudate.  Eyes:     Conjunctiva/sclera: Conjunctivae normal.  Neck:     Thyroid: No thyromegaly.     Trachea: Trachea normal. No tracheal tenderness or tracheal deviation.  Cardiovascular:     Rate and Rhythm: Normal rate and regular rhythm.     Heart sounds: Normal heart sounds, S1 normal and S2 normal. No murmur heard. Pulmonary:     Effort: No respiratory distress.     Breath sounds: Normal breath sounds. No stridor. No wheezing or rales.  Lymphadenopathy:     Head:     Right side of head: No tonsillar adenopathy.     Left side of head: No tonsillar adenopathy.     Cervical: No cervical adenopathy.  Skin:    Findings: No erythema or rash (Approximately 10 small less than 1 cm erythematous somewhat raised slightly blanching lesions anterior chest.  Multiple telangiectasias.).     Nails: There is no clubbing.  Neurological:     Mental Status: She is alert.    Diagnostics: none  Assessment and Plan:   1. Allergic urticaria   2. Angioedema, subsequent encounter   3. Inflammatory dermatosis   4. LPRD (laryngopharyngeal reflux disease)    1.  Every day use the following:  A. Famotidine 20 mg - 1 tablet 2 times per day B. Loratadine 10 mg - 1 tablet 2 times per day B. Montelukast 10 mg - 1 tablet 1 time per day  2. Start Mometasone 0.1% ointment - apply - 1-7 times per week until resolved  3. Return to clinic in 6 months or earlier if problem  Katrina Lawson has very good control over her overactive immune system manifest as urticaria and angioedema on her  current plan and she will remain on this collection of an H1 H2 receptor blocker leukotriene modifier.  She has some form of inflammatory dermatosis affecting her chest and we will try her on some topical mometasone aiming for the least amount of this medication that will control this issue.  I will see her back in this clinic in 6 months or earlier if there  is a problem.  [name], MD Allergy / Immunology [place]

## 2021-04-04 NOTE — Patient Instructions (Addendum)
°  1.  Every day use the following:  A. Famotidine 20 mg - 1 tablet 2 times per day B. Loratadine 10 mg - 1 tablet 2 times per day B. Montelukast 10 mg - 1 tablet 1 time per day  2. Start Mometasone 0.1% ointment - apply - 1-7 times per week until resolved  3. Return to clinic in 6 months or earlier if problem

## 2021-04-05 ENCOUNTER — Encounter: Payer: Self-pay | Admitting: Allergy and Immunology

## 2021-05-24 ENCOUNTER — Other Ambulatory Visit: Payer: Self-pay | Admitting: Allergy and Immunology

## 2022-07-17 IMAGING — MG MM DIGITAL SCREENING BILAT W/ TOMO AND CAD
8 series · 8 of 24 positions shown · non-contrast
Comparison: Previous exam(s).

CLINICAL DATA: Screening.

EXAM:
DIGITAL SCREENING BILATERAL MAMMOGRAM WITH TOMOSYNTHESIS AND CAD
TECHNIQUE: Bilateral screening digital craniocaudal and mediolateral oblique
mammograms were obtained. Bilateral screening digital breast
tomosynthesis was performed. The images were evaluated with
computer-aided detection.

[L MLO synth-2D]
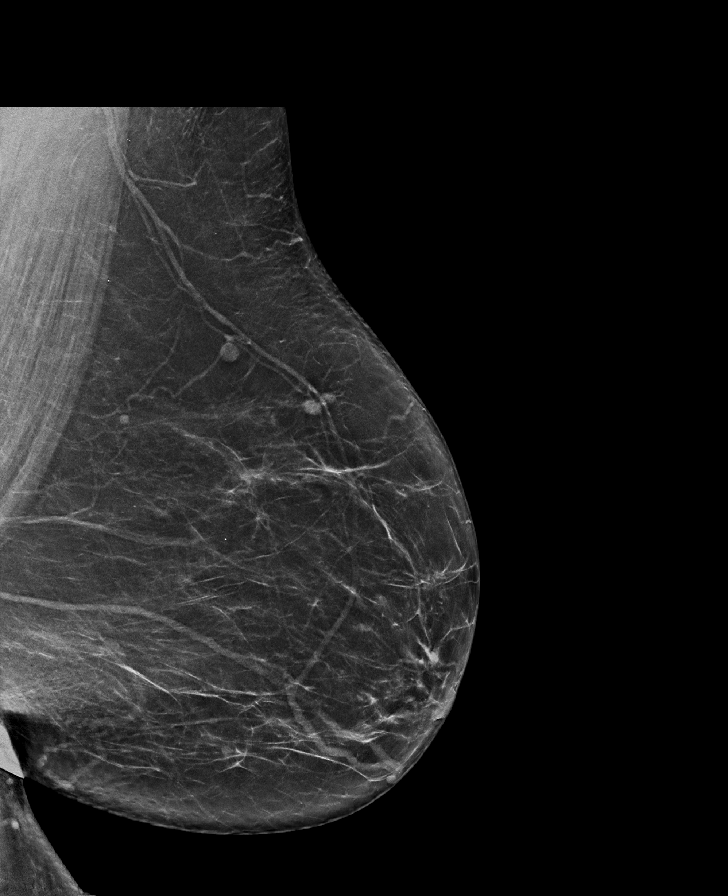

[R MLO synth-2D]
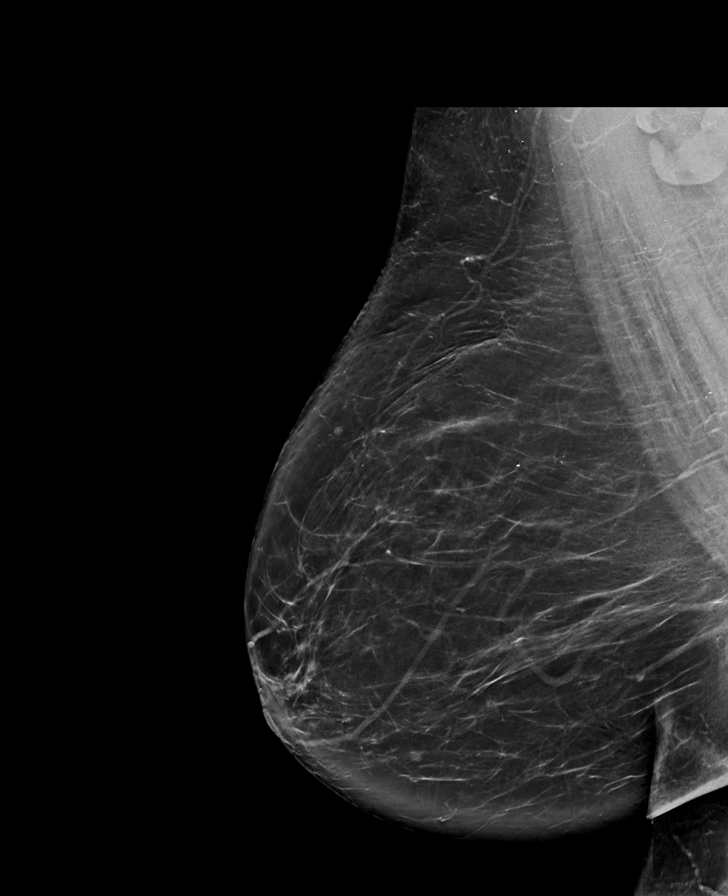

[L CC synth-2D]
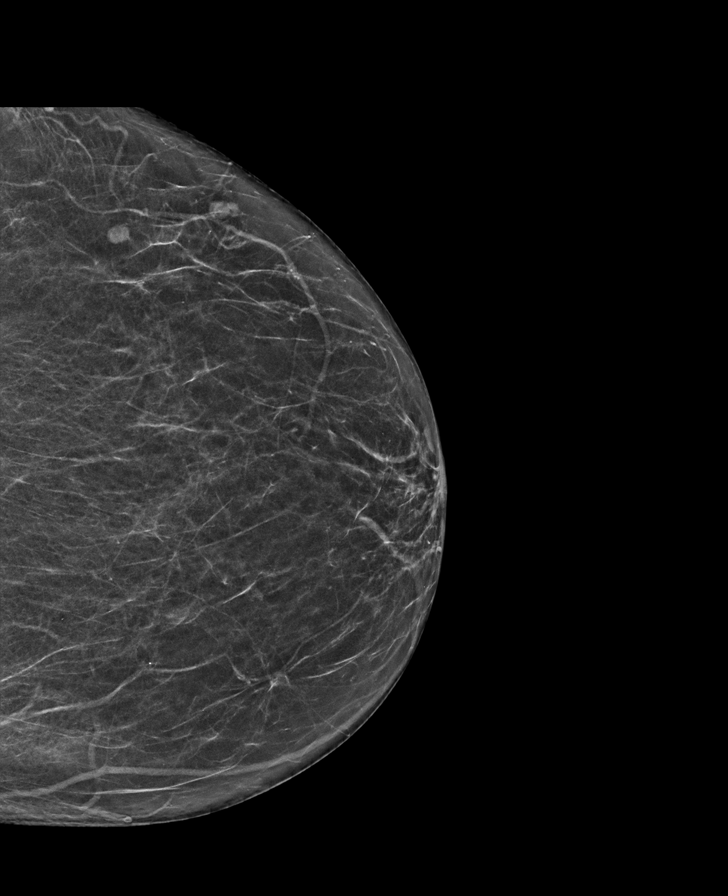

[R CC synth-2D]
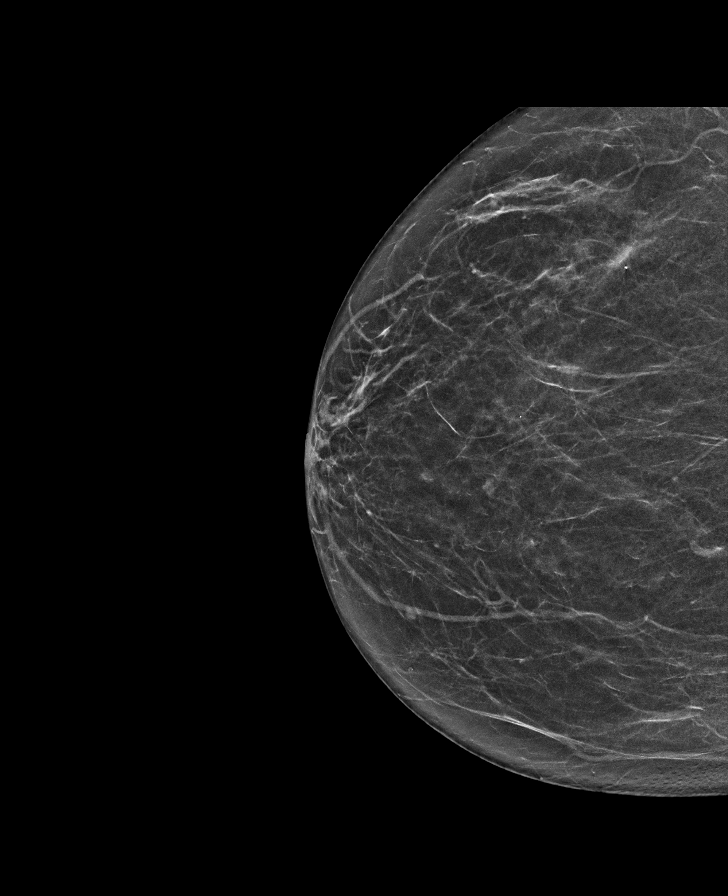

[R MLO tomo · tomo slice 45/89.0]
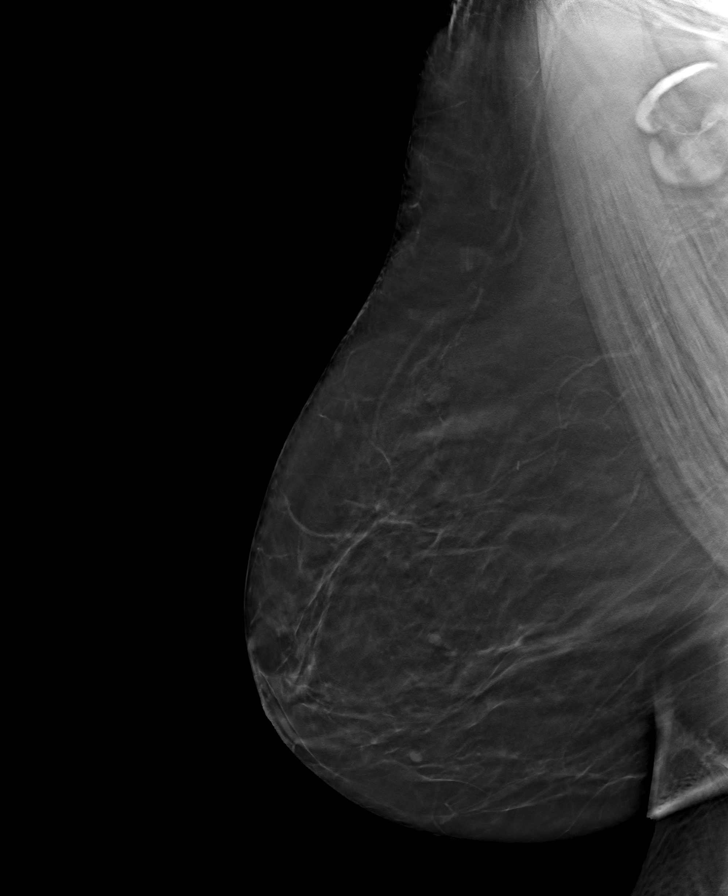

[L MLO tomo · tomo slice 38/75.0]
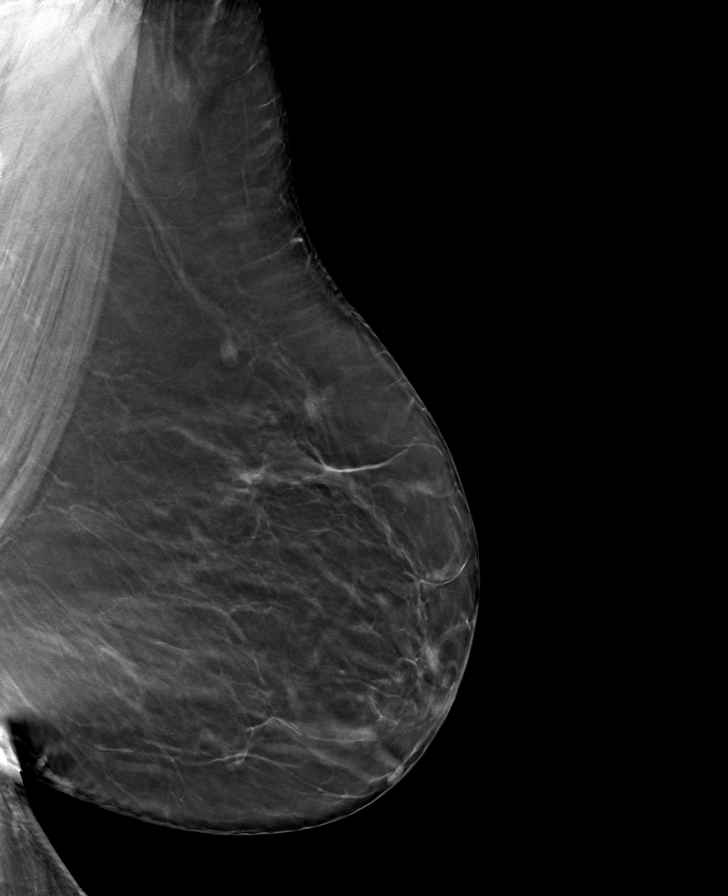

[R CC tomo · tomo slice 30/59.0]
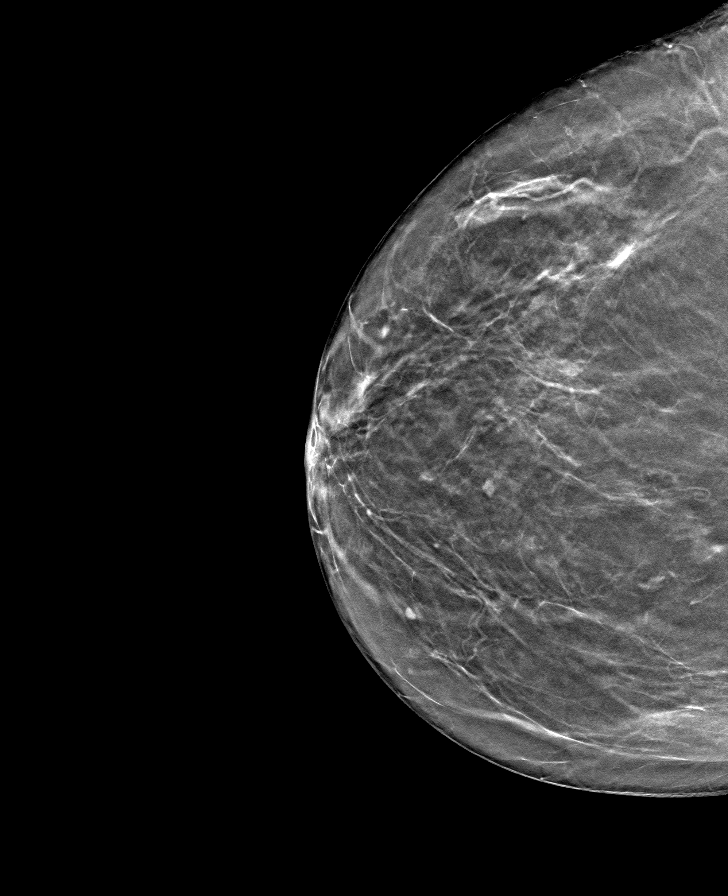

[L CC tomo · tomo slice 30/59.0]
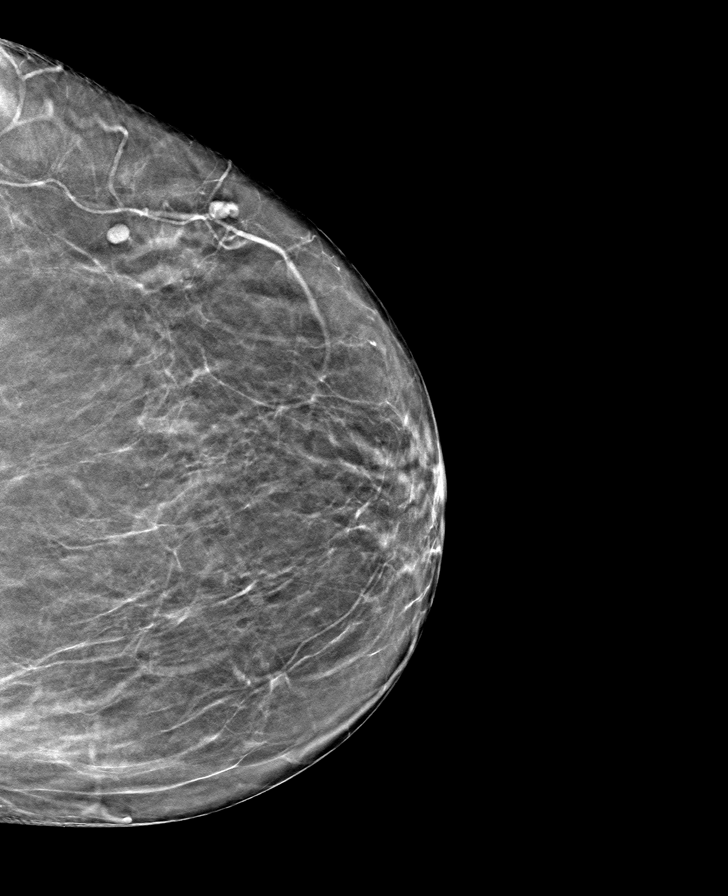

[8 of 24 positions shown; findings below may reference images not displayed]

ACR Breast Density Category b: There are scattered areas of
fibroglandular density.
FINDINGS: There are no findings suspicious for malignancy.
IMPRESSION: No mammographic evidence of malignancy. A result letter of this
screening mammogram will be mailed directly to the patient.

RECOMMENDATION:
Screening mammogram in one year. (Code:51-O-LD2)

BI-RADS CATEGORY  1: Negative.

## 2022-08-11 ENCOUNTER — Other Ambulatory Visit: Payer: Self-pay | Admitting: Family Medicine

## 2022-08-11 DIAGNOSIS — Z1231 Encounter for screening mammogram for malignant neoplasm of breast: Secondary | ICD-10-CM

## 2022-08-31 ENCOUNTER — Ambulatory Visit
Admission: RE | Admit: 2022-08-31 | Discharge: 2022-08-31 | Disposition: A | Discharge status: Home / Self Care | Payer: BC Managed Care – PPO | Source: Ambulatory Visit | Attending: Family Medicine | Admitting: Family Medicine

## 2022-08-31 DIAGNOSIS — Z1231 Encounter for screening mammogram for malignant neoplasm of breast: Secondary | ICD-10-CM

## 2023-08-27 ENCOUNTER — Other Ambulatory Visit: Payer: Self-pay | Admitting: Family Medicine

## 2023-08-27 DIAGNOSIS — Z1231 Encounter for screening mammogram for malignant neoplasm of breast: Secondary | ICD-10-CM

## 2023-09-03 ENCOUNTER — Ambulatory Visit
Admission: RE | Admit: 2023-09-03 | Discharge: 2023-09-03 | Disposition: A | Discharge status: Home / Self Care | Payer: Self-pay | Source: Ambulatory Visit | Attending: Family Medicine | Admitting: Family Medicine

## 2023-09-03 DIAGNOSIS — Z1231 Encounter for screening mammogram for malignant neoplasm of breast: Secondary | ICD-10-CM

## 2024-03-04 NOTE — Progress Notes (Addendum)
 Liver Clinic - Return Evaluation  Primary Care Provider [place], Charlene CROME, MD [address] [facility] [place] [address]   Chief complaint: Chief Complaint  Patient presents with   PBC    History of Present Illness:  Katrina Lawson is a 63 y.o. female who presents for ongoing management of PBC.  She underwent a biliopancreatic division with duodenal switch in August 2017. She carries a previous diagnosis of PBC. She also has a diagnosis of DM, GERD, and potentially MASLD. She was noted to have an liver tests, predominantly alkaline phosphatase in the past, and would be referred to a gastroenterologist for a work-up.  She underwent a liver biopsy in February 2023, which was suspicious for primary biliary cholangitis.  Her ANA was negative, ANA was positive, although no titer in Care Everywhere.  She was subsequently prescribed ursodiol, 500 mg twice daily.  On her serological evaluation, she had a AMA <20, ANA positive, anti-smooth muscle ab 14. HBV sAg, HBsAb non reactive, HBcAb negative, HCV Ab nonreactive.  Interval Events: Last seen in June 2025 via telemedicine.  She remains on UDCA 500 mg BID. Tolerates well without missed doses. History of Present Illness Intermittent pruritus persists, described as pen pricks or generalized itching, managed with hydroxyzine and non-pharmacologic measures including exfoliating towels and hydration. Skin is notably dry, especially in winter, with occasional mild itching. No recent changes in soaps or use of new products. No alcohol use reported.  Over the past five months, significant weight gain and persistent bloating have developed, which she attributes to oral prednisone (no longer taking) and steroid injections for ocular inflammation. Probiotics and dietary modifications, including avoidance of processed foods and adherence to a Mediterranean-style diet, have been implemented with support from her son.  Denies LE  edema. No black or bloody stool. No current oral steroid use. Oral prednisone was taken for three months earlier this year, and two steroid injections were received in the past five months for left eye inflammation.  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  She has no evidence of decompensated liver disease - no icterus, jaundice, confusion, melena, hematochezia, hematemesis, bruising, weight loss, abdominal swelling, lower extremity swelling.   Health Maintenance CRC screening: July 2021, no polyps, 10 year recall. Reports it was completed 2024 locally Hepatitis A serology NR, #1 10/28/2021 [facility], completed locally Hepatitis B serology nonreactive, #1 10/28/2021 [facility], completed locally Influenza: Declines Pneumonia: Prevnar followed by pneumovax after 8 weeks   General Review of Systems:  General ROS: negative for - chills, fever, night sweats and weight loss Psychological ROS: negative for - anxiety and depression Allergy and Immunology ROS: negative for - severe allergic reactions Hematological and Lymphatic ROS: negative for - bleeding problems and swollen lymph nodes Endocrine ROS: negative for - skin changes and temperature intolerance Respiratory ROS: negative for - cough and shortness of breath Cardiovascular ROS: negative for - chest pain and palpitations Musculoskeletal ROS: negative for - joint pain and joint swelling Neurological ROS: negative for - gait disturbance and headaches Dermatological ROS: negative for rash  Past Medical History: Past Medical History:  Diagnosis Date   Diabetes (CMS/HHS-HCC)    NAFLD (nonalcoholic fatty liver disease)     Past Surgical History: Past Surgical History:  Procedure Laterality Date   biliopancreatic division with duodenal switch  10/2015   CATARACT EXTRACTION Left 06/18/2023   CHOLECYSTECTOMY LAPAROSCOPIC W/COMMON BILE DUCT EXPLORATION     HERNIA REPAIR     LAPAROSCOPIC  TUBAL LIGATION       Allergies: Nsaids (non-steroidal anti-inflammatory drug)  Medications: Current Outpatient Medications  Medication Sig Dispense Refill   acetaminophen  (TYLENOL ) 325 MG tablet Take 325 mg by mouth as needed     ascorbic acid, vitamin C, 550 mg/1.1 gram (scoop) Powd Take 1,000 mg by mouth every morning     bergamot extract (CITRUS BERGAMOT ORAL) Take 1,000 mg by mouth once daily     cholecalciferol, vitamin D3, (CHOLECALCIFEROL, VIT D3,,BULK,) 100,000 unit/gram Powd Take 1 tablet by mouth every morning     coQ10, ubiquinol, 100 mg Cap Take 100 mg by mouth once daily     digestive enzymes Tab Take 1 tablet by mouth every morning     glutamine 500 mg capsule Take 1 capsule by mouth once daily     hydrOXYzine (ATARAX) 25 MG tablet Take 1 tablet by mouth as needed     KLOR-CON M20 20 mEq ER tablet TAKE 1 TABLET BY MOUTH EVERY DAY FOR POTASSIUM     Lactobacillus acidophilus 10 billion cell Cap Take 1 tablet by mouth every morning     lidocaine  HCl-hydrocortison ac 3-0.5 % Crea APPLY CREAM 3 TIMES A DAY AS NEEDED HEMORRHOID FLARE     magnesium oxide 200 mg magnesium Chew Take 200 mg by mouth 2 (two) times daily     meclizine (ANTIVERT) 25 mg tablet Take 25 mg by mouth as needed for Dizziness     mometasone  (ELOCON ) 0.1 % ointment CAN APPLY TO SKIN ONCE DAILY IF NEEDED.     multivitamin with minerals tablet Take 1 tablet by mouth every morning     ondansetron (ZOFRAN-ODT) 4 MG disintegrating tablet Take 4 mg by mouth as needed     ubidecarenone (COQ-10 ORAL) Take by mouth     UNABLE TO FIND Take 1 capsule by mouth once daily Med Name: Hepatatone Plus (herbs, mushroom extracts, and NAC)     ursodioL (URSO FORTE) 500 MG tablet TAKE 1 TABLET BY MOUTH TWICE A DAY 180 tablet 2   venlafaxine (EFFEXOR-XR) 150 MG XR capsule Take 150 mg by mouth once daily     ferrous sulfate 325 (65 FE) MG EC tablet Take 325 mg by mouth every morning (Patient not taking: Reported on 03/04/2024)      rosuvastatin (CRESTOR) 5 MG tablet Take 5 mg by mouth once daily (Patient not taking: Reported on 03/04/2024)     WEGOVY 0.25 mg/0.5 mL pen injector Inject 0.5 mLs subcutaneously once a week (Patient not taking: Reported on 03/04/2024)     No current facility-administered medications for this visit.    Social History: Social History   Socioeconomic History   Marital status: Widowed  Tobacco Use   Smoking status: Never   Smokeless tobacco: Never  Substance and Sexual Activity   Alcohol use: Never   Drug use: Never   Sexual activity: Not Currently  Social History Narrative   Teaching aid and bus driver   Significant other, lives alone   Social Drivers of Health   Housing Stability: Unknown (02/29/2024)   Housing Stability Vital Sign    Homeless in the Last Year: No    Family History: Family History  Problem Relation Age of Onset   No Known Problems Mother    Alcohol abuse Father    No Known Problems Sister    No Known Problems Brother    Physical Examination: Vitals:   03/04/24 0941  BP: 139/83  Pulse: 104  Resp: 18  Temp: 36.8 C (98.3 F)   Body mass index is 34.48 kg/m. Weight: 88.3 kg (194 lb 10.7 oz) (shoes)   General:  The patient is in no acute distress.   EENT:  The OP is clear, the neck is supple, there is no JVD.  The sclera are not icteric.     Respiratory:  Full & clear bilaterally with no areas of dullness to percussion or egophony.  There is no respiratory distress. Cardiovascular:  Regular rhythm. No murmurs, rubs or gallops appreciated.  Gastrointestinal:  Soft, non tender, non distended, positive bowel sounds.  There is no hepatosplenomegaly.  There is no ascites.   Muskuloskeletal:  There is no pretibial, flank, or sacral edema.  There is no clubbing, cyanosis.  There was no palmar erythema. Skin:  There is no rash.  There were no spider angiomata. Lymphatic/Immunology: No LAD in the neck Neuro:  Grossly intact.  The patient is  able to stand easily.  No assistance was needed on or off of the exam table.  Affect and speech normal.  No asterixis. Psych: The patient is awake, alert, oriented. The mood & affect are noted to be appropriate.   Labs and Studies: Recent Labs    07/08/21 1256 10/28/21 1204 03/04/24 1036  NA 142 141  --   K 3.1* 3.0*  --   CL 107 110*  --   CO2 25 24  --   BUN 14 20  --   CREATININE 0.7 0.8  --   GLUCOSE 85 92  --   CALCIUM 9.5 9.3  --   AST 24 18 23   ALT 21 20 22   TBILI <0.1* 0.2* 0.2*  ALKPHOS 113* 109 127*  ALB 3.6 3.5 3.3*  TOTALPROTEIN 6.7 7.7 8.7*   Recent Labs    07/08/21 1256 02/13/23 1335 03/09/23 0951 08/28/23 0926 03/04/24 1036  WBC 8.6 12.5* 11.5* 9.0 13.0*  HGB 12.9  --   --   --  11.9  HCT 41.2 39.3 38.9 38.0 40.2  PLT 312 402 419 459* 537*   Recent Labs    07/08/21 1256 05/15/23 0831  INR 1.1 1.1   Recent Labs    07/08/21 1256  ANASCREEN Positive*  MITOCHSCREEN Positive*  ASMASCREEN Negative  IGG 1,370  IGM 190  IGA 348*  IGE <5     April 29, 2021. RANDOM LIVER  A. Liver, random needle core biopsy, outside case, [phone], [facility], [place], [place], date of procedure 04-29-2021: LIVER WITH DIFFUSE MODERATE IMMUNE-MEDIATED BILE DUCT INJURY MORPHOLOGICALLY COMPATIBLE WITH PRIMARY BILIARY CHOLANGITIS. See comment and microscopic description. SUSPICIOUS FOR BRIDGING FIBROSIS.   COMMENT: Most portal triads are expanded by a moderate amount of chronic inflammation. The portal-based inflammation appears centered on interlobular bile ducts with associated moderate to severe bile duct injury. 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  Assessment & Plan: Ms. Abernathy is a 63 y.o. 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  1. Primary biliary cholangitis (CMS/HHS-HCC)    Arvie has a personal history of having undergone a duodenal switch in August 2017. She  underwent a liver biopsy in February 2023, which is supportive of PBC.  An MRI with MRCP demonstrated no evidence of macro ductular stricturing.  A diagnosis of PBC was given to the patient, and she was started on ursodiol.   ALP continues to improve, 127, down from 146. We did discuss second line therapies, such as seladelpar and elafibranor, but given  ongoing improvement in ALP will continue UDCA monotherapy. Consider addition of PPAR if ALP does not normalize.  While she had some mild indication of portal portal bridging, she does not have cirrhosis or evidence of portal hypertension.  This was discussed with the patient at her previous visit, including the option to undergo [facility] surveillance.  She declines surveillance and we are comfortable with this.   -Labs today -Continue ursodiol, 500 mg twice daily (11.3 mg/kg/day) -Thyroid status (thyroid stimulating hormone) annually - 08/2023 -Bone mineral densitometry every two to four years, she has been diagnosed with osteopenia  2. 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  - Can have this rechecked with PCP, consider referral to hematology if remains elevated  Orders Placed This Encounter  Procedures   Complete Blood Count (CBC) with Differential   Hepatic Function Panel (HFP)    New Prescriptions   No medications on file    The risks and benefits of my recommendations, as well as other treatment options were discussed with the patient today. Questions were answered. Follow up:  6 months   This note has been created using automated tools and reviewed for accuracy by [facility] DAWKINS.     Lauraine Ladora Lento, FNP-C Gastroenterology and Transplant Hepatology [phone]    Attestation Statement:   I personally performed the service, non-incident to. [facility])   SARAH HOLBROOKS DAWKINS, NP

## 2024-03-31 ENCOUNTER — Ambulatory Visit: Payer: Self-pay | Admitting: Oncology

## 2024-03-31 ENCOUNTER — Encounter: Payer: Self-pay | Admitting: Oncology

## 2024-03-31 ENCOUNTER — Inpatient Hospital Stay: Attending: Oncology | Admitting: Oncology

## 2024-03-31 ENCOUNTER — Inpatient Hospital Stay

## 2024-03-31 VITALS — BP 149/79 | HR 110 | Temp 99.1°F | Resp 19 | Ht 62.5 in | Wt 197.2 lb

## 2024-03-31 DIAGNOSIS — D75839 Thrombocytosis, unspecified: Secondary | ICD-10-CM | POA: Diagnosis not present

## 2024-03-31 LAB — CBC WITH DIFFERENTIAL (CANCER CENTER ONLY)
Abs Immature Granulocytes: 0.07 K/uL (ref 0.00–0.07)
Basophils Absolute: 0.1 K/uL (ref 0.0–0.1)
Basophils Relative: 1 %
Eosinophils Absolute: 0.3 K/uL (ref 0.0–0.5)
Eosinophils Relative: 2 %
HCT: 38.8 % (ref 36.0–46.0)
Hemoglobin: 11.3 g/dL — ABNORMAL LOW (ref 12.0–15.0)
Immature Granulocytes: 1 %
Lymphocytes Relative: 26 %
Lymphs Abs: 3.2 K/uL (ref 0.7–4.0)
MCH: 22 pg — ABNORMAL LOW (ref 26.0–34.0)
MCHC: 29.1 g/dL — ABNORMAL LOW (ref 30.0–36.0)
MCV: 75.6 fL — ABNORMAL LOW (ref 80.0–100.0)
Monocytes Absolute: 0.9 K/uL (ref 0.1–1.0)
Monocytes Relative: 7 %
Neutro Abs: 7.9 K/uL — ABNORMAL HIGH (ref 1.7–7.7)
Neutrophils Relative %: 63 %
Platelet Count: 458 K/uL — ABNORMAL HIGH (ref 150–400)
RBC: 5.13 MIL/uL — ABNORMAL HIGH (ref 3.87–5.11)
RDW: 15.7 % — ABNORMAL HIGH (ref 11.5–15.5)
WBC Count: 12.4 K/uL — ABNORMAL HIGH (ref 4.0–10.5)
nRBC: 0 % (ref 0.0–0.2)

## 2024-03-31 LAB — IRON AND TIBC
Iron: 28 ug/dL (ref 28–170)
Saturation Ratios: 5 % — ABNORMAL LOW (ref 10.4–31.8)
TIBC: 575 ug/dL — ABNORMAL HIGH (ref 250–450)
UIBC: 548 ug/dL

## 2024-03-31 LAB — SEDIMENTATION RATE: Sed Rate: 68 mm/h — ABNORMAL HIGH (ref 0–30)

## 2024-03-31 LAB — FERRITIN: Ferritin: 26 ng/mL (ref 11–307)

## 2024-03-31 NOTE — Progress Notes (Signed)
 New patient; Thrombocytosis, unspecified, referred by Lauraine Bonito.

## 2024-03-31 NOTE — Progress Notes (Signed)
 "  Hematology/Oncology Consult note [facility] Telephone:([phone] [phone]  Patient Care Team: Stephanie Charlene CROME, MD as PCP - General (Family Medicine) Melanee Annah BROCKS, MD as Consulting Physician (Oncology)   Name of the patient: Katrina Lawson  [id number]  1961-02-12    Reason for referral-thrombocytosis   Referring physician-Sarah Holbrooks, NP  Date of visit: 03/31/24   History of presenting illness-patient is a 64 year old female with a past medical history significant for hypertension hyperlipidemia type 2 diabetes and iron deficiency Referred for thrombocytosis.  She had gastric bypass surgery 8 years ago.  CBC from 03/04/2024 showed white count of 13, H&H of 11.9/40.2 with an MCV of 76 and a platelet count of 537.  No recent iron studies have been checked.  Looking back at her prior CBCs patient's hemoglobin typically runs between 12-13.  Platelets have mostly been in the 400s over the last 2 years.  Last colonoscopy Anoscopy was in July 2021 which was normal and repeat colonoscopy was recommended in 10 years  She is asymptomatic, with no history of bleeding or thrombotic events. She denies hematuria, melena, or hematochezia.  Recent laboratory studies revealed mild microcytic anemia. Iron studies have not been performed recently. She has undergone two colonoscopies, most recently two years ago, both of which were normal. Previous colonoscopies included polypectomy, but no polyps were found in the last two procedures. Upper endoscopy was also normal.  She is followed for cirrhosis and is uncertain of the specific stage.     ECOG PS- 1  Pain scale- 0   Review of systems- ROS  Allergies[1]  Patient Active Problem List   Diagnosis Date Noted   BMI 31.0-31.9,adult 04/27/2017   Elevated alkaline phosphatase level 01/09/2017   Epigastric pain 01/09/2017   Nausea 12/05/2016   History of bariatric surgery 10/24/2016   Obstructive  sleep apnea 08/27/2015   Hypertriglyceridemia 08/17/2015   Iron deficiency 08/17/2015   Steatosis of liver 07/12/2015   Depressive disorder 06/29/2015   Diabetes mellitus (HCC) 06/29/2015   Essential hypertension 06/29/2015   Heartburn 06/29/2015   Morbid obesity (HCC) 06/29/2015     Past Medical History:  Diagnosis Date   Anxiety    Diabetes mellitus without complication (HCC)    Hyperlipidemia    pt denies   Hypertension    pt denies   Low iron    Urticaria      Past Surgical History:  Procedure Laterality Date   CERVICAL ABLATION     CHOLECYSTECTOMY  2018   HERNIA REPAIR     ROUX-EN-Y PROCEDURE  2017   TUBAL LIGATION      Social History   Socioeconomic History   Marital status: Widowed    Spouse name: Not on file   Number of children: Not on file   Years of education: Not on file   Highest education level: Not on file  Occupational History   Not on file  Tobacco Use   Smoking status: Never   Smokeless tobacco: Never  Vaping Use   Vaping status: Never Used  Substance and Sexual Activity   Alcohol use: No   Drug use: No   Sexual activity: Not on file  Other Topics Concern   Not on file  Social History Narrative   Not on file   Social Drivers of Health   Tobacco Use: Low Risk  (03/04/2024)   Received from [facility]   Patient History    Smoking Tobacco Use: Never  Smokeless Tobacco Use: Never    Passive Exposure: Not on file  Financial Resource Strain: Not on file  Food Insecurity: No Food Insecurity (03/28/2024)   Epic    Worried About Programme Researcher, Broadcasting/film/video in the Last Year: Never true    Ran Out of Food in the Last Year: Never true  Transportation Needs: No Transportation Needs (03/28/2024)   Epic    Lack of Transportation (Medical): No    Lack of Transportation (Non-Medical): No  Physical Activity: Not on file  Stress: Not on file  Social Connections: Not on file  Intimate Partner  Violence: Not on file  Depression (EYV7-0): Not on file  Alcohol Screen: Not on file  Housing: Low Risk (03/28/2024)   Epic    Unable to Pay for Housing in the Last Year: No    Number of Times Moved in the Last Year: 0    Homeless in the Last Year: No  Utilities: Not At Risk (03/28/2024)   Epic    Threatened with loss of utilities: No  Health Literacy: Not on file     Family History  Problem Relation Age of Onset   Colon cancer Neg Hx    Colon polyps Neg Hx    Esophageal cancer Neg Hx    Rectal cancer Neg Hx    Stomach cancer Neg Hx     Current Medications[2]   Physical exam:  Vitals:   03/31/24 1106  BP: (!) 149/79  Pulse: (!) 110  Resp: 19  Temp: 99.1 F (37.3 C)  TempSrc: Tympanic  SpO2: 98%  Weight: 197 lb 3.2 oz (89.4 kg)  Height: 5' 2.5 (1.588 m)   Physical Exam        Latest Ref Rng & Units 09/15/2020   10:55 AM  CMP  Total Protein 6.0 - 8.5 g/dL 7.5   Total Bilirubin 0.0 - 1.2 mg/dL <9.7   Alkaline Phos 44 - 121 IU/L 130   AST 0 - 40 IU/L 55   ALT 0 - 32 IU/L 51       Latest Ref Rng & Units 09/15/2020   10:55 AM  CBC  WBC 3.4 - 10.8 x10E3/uL 11.7   Hemoglobin 11.1 - 15.9 g/dL 89.3   Hematocrit 65.9 - 46.6 % 34.7   Platelets 150 - 450 x10E3/uL 459       Assessment and plan- Patient is a 64 y.o. female referred for thrombocytosis  Discussed differences between primary thrombocytosis which can be seen in myeloproliferative disorders versus reactive thrombocytosis which can be seen in conditions such as iron deficiency.  Planning to check CBC with differential, JAK2, CALR, MPL mutation testing along with BCR-ABL FISH testing ferritin and iron studies ESR today.  See me in 2 to 3 weeks time to discuss the results of blood work and further management.  If she has evidence of myeloproliferative disorder we will consider getting a bone marrow biopsy   Thank you for this kind referral and the opportunity to participate in the care of this  patient   Visit Diagnosis 1. Thrombocytosis     Dr. Annah Skene, MD, MPH [facility] [phone] 03/31/2024                     [1] Allergies Allergen Reactions   Tolmetin Other (See Comments)    REACTION: hives   Nsaids Hives    Only when taken in large volumes.  [2]  Current Outpatient Medications:  Ascorbic Acid (VITAMIN C PO), Take 1,000 mg by mouth daily., Disp: , Rfl:    Bacillus Coagulans-Inulin (PROBIOTIC-PREBIOTIC PO), Take by mouth., Disp: , Rfl:    Cholecalciferol (VITAMIN D3 PO), Take by mouth., Disp: , Rfl:    Digestive Enzymes (SUPER ENZYMES PO), Take by mouth., Disp: , Rfl:    famotidine  (PEPCID ) 20 MG tablet, TAKE 1 TABLET BY MOUTH TWICE A DAY, Disp: 180 tablet, Rfl: 1   ferrous sulfate 325 (65 FE) MG EC tablet, Take 325 mg by mouth 3 (three) times daily with meals., Disp: , Rfl:    hydrOXYzine (ATARAX/VISTARIL) 25 MG tablet, Take 25 mg by mouth 4 (four) times daily as needed., Disp: , Rfl:    L-Glutamine 500 MG CAPS, Take by mouth in the morning, at noon, and at bedtime., Disp: , Rfl:    Magnesium 200 MG CHEW, Chew by mouth., Disp: , Rfl:    mometasone  (ELOCON ) 0.1 % ointment, Can apply to skin once daily if needed., Disp: 45 g, Rfl: 3   montelukast (SINGULAIR) 10 MG tablet, Take 10 mg by mouth daily., Disp: , Rfl:    Multiple Vitamins-Minerals (MULTIVITAMIN WITH MINERALS) tablet, Take 1 tablet by mouth daily., Disp: , Rfl:    Nutritional Supplements (NUTRITIONAL SUPPLEMENT PO), Take 1 tablet by mouth daily. Detoxication Factors- Phase 1 and Phase 2 Support ( made by [employer]), Disp: , Rfl:    ondansetron (ZOFRAN-ODT) 4 MG disintegrating tablet, Take 4 mg by mouth every 8 (eight) hours as needed., Disp: , Rfl:    Turmeric Curcumin 500 MG CAPS, Take 500 mg by mouth daily. Curcumin Elite, Disp: , Rfl:    venlafaxine XR (EFFEXOR-XR) 150 MG 24 hr capsule, Take 150 mg by mouth daily with  breakfast., Disp: , Rfl:    zinc gluconate 50 MG tablet, Take 50 mg by mouth daily., Disp: , Rfl:  "

## 2024-04-08 LAB — JAK2 V617F RFX CALR/MPL/E12-15

## 2024-04-08 LAB — CALR +MPL + E12-E15  (REFLEX)

## 2024-04-25 ENCOUNTER — Inpatient Hospital Stay: Admitting: Oncology

## 2024-04-25 ENCOUNTER — Inpatient Hospital Stay
